# Patient Record
Sex: Female | Born: 1955 | Race: White | Hispanic: No | Smoking: Never smoker
Health system: Southern US, Community
[De-identification: ages and names within clinical notes are randomized; demographics above are authoritative.]

## PROBLEM LIST (undated history)

## (undated) DIAGNOSIS — M722 Plantar fascial fibromatosis: Secondary | ICD-10-CM

## (undated) DIAGNOSIS — M5416 Radiculopathy, lumbar region: Secondary | ICD-10-CM

## (undated) DIAGNOSIS — M549 Dorsalgia, unspecified: Secondary | ICD-10-CM

## (undated) DIAGNOSIS — D229 Melanocytic nevi, unspecified: Secondary | ICD-10-CM

## (undated) DIAGNOSIS — M5412 Radiculopathy, cervical region: Secondary | ICD-10-CM

## (undated) DIAGNOSIS — D239 Other benign neoplasm of skin, unspecified: Secondary | ICD-10-CM

## (undated) DIAGNOSIS — G8929 Other chronic pain: Secondary | ICD-10-CM

## (undated) DIAGNOSIS — M542 Cervicalgia: Secondary | ICD-10-CM

## (undated) HISTORY — PX: OTHER SURGICAL HISTORY: SHX169

## (undated) HISTORY — PX: ABDOMINAL HYSTERECTOMY: SHX81

## (undated) HISTORY — PX: APPENDECTOMY: SHX54

---
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
## 2001-07-24 DIAGNOSIS — D239 Other benign neoplasm of skin, unspecified: Secondary | ICD-10-CM

## 2001-07-24 DIAGNOSIS — D229 Melanocytic nevi, unspecified: Secondary | ICD-10-CM

## 2001-07-24 HISTORY — DX: Other benign neoplasm of skin, unspecified: D23.9

## 2001-07-24 HISTORY — DX: Melanocytic nevi, unspecified: D22.9

## 2004-02-24 ENCOUNTER — Ambulatory Visit (HOSPITAL_COMMUNITY): Admission: RE | Admit: 2004-02-24 | Discharge: 2004-02-24 | Payer: Self-pay | Admitting: Podiatry

## 2004-03-14 ENCOUNTER — Emergency Department (HOSPITAL_COMMUNITY): Admission: EM | Admit: 2004-03-14 | Discharge: 2004-03-14 | Payer: Self-pay | Admitting: Emergency Medicine

## 2004-04-03 ENCOUNTER — Ambulatory Visit (HOSPITAL_COMMUNITY): Admission: RE | Admit: 2004-04-03 | Discharge: 2004-04-03 | Payer: Self-pay | Admitting: Family Medicine

## 2005-04-21 ENCOUNTER — Encounter (INDEPENDENT_AMBULATORY_CARE_PROVIDER_SITE_OTHER): Payer: Self-pay | Admitting: Internal Medicine

## 2005-08-27 ENCOUNTER — Ambulatory Visit (HOSPITAL_COMMUNITY): Admission: RE | Admit: 2005-08-27 | Discharge: 2005-08-27 | Payer: Self-pay | Admitting: Internal Medicine

## 2005-08-27 ENCOUNTER — Ambulatory Visit: Payer: Self-pay | Admitting: Internal Medicine

## 2005-08-28 ENCOUNTER — Encounter (INDEPENDENT_AMBULATORY_CARE_PROVIDER_SITE_OTHER): Payer: Self-pay | Admitting: Internal Medicine

## 2005-08-28 LAB — CONVERTED CEMR LAB
HDL: 51 mg/dL
LDL Cholesterol: 122 mg/dL
Triglycerides: 189 mg/dL

## 2005-09-04 ENCOUNTER — Ambulatory Visit (HOSPITAL_COMMUNITY): Admission: RE | Admit: 2005-09-04 | Discharge: 2005-09-04 | Payer: Self-pay | Admitting: Internal Medicine

## 2005-09-14 ENCOUNTER — Encounter (INDEPENDENT_AMBULATORY_CARE_PROVIDER_SITE_OTHER): Payer: Self-pay | Admitting: Internal Medicine

## 2005-09-22 ENCOUNTER — Ambulatory Visit: Payer: Self-pay | Admitting: Internal Medicine

## 2005-09-23 ENCOUNTER — Encounter (INDEPENDENT_AMBULATORY_CARE_PROVIDER_SITE_OTHER): Payer: Self-pay | Admitting: Internal Medicine

## 2005-09-27 ENCOUNTER — Encounter (INDEPENDENT_AMBULATORY_CARE_PROVIDER_SITE_OTHER): Payer: Self-pay | Admitting: Internal Medicine

## 2005-09-30 ENCOUNTER — Ambulatory Visit (HOSPITAL_COMMUNITY): Admission: RE | Admit: 2005-09-30 | Discharge: 2005-09-30 | Payer: Self-pay | Admitting: Internal Medicine

## 2005-10-04 ENCOUNTER — Encounter (INDEPENDENT_AMBULATORY_CARE_PROVIDER_SITE_OTHER): Payer: Self-pay | Admitting: Internal Medicine

## 2006-02-01 ENCOUNTER — Ambulatory Visit: Payer: Self-pay | Admitting: Internal Medicine

## 2006-04-07 ENCOUNTER — Ambulatory Visit (HOSPITAL_COMMUNITY): Admission: RE | Admit: 2006-04-07 | Discharge: 2006-04-07 | Payer: Self-pay | Admitting: Unknown Physician Specialty

## 2006-04-14 ENCOUNTER — Encounter (INDEPENDENT_AMBULATORY_CARE_PROVIDER_SITE_OTHER): Payer: Self-pay | Admitting: Internal Medicine

## 2006-05-31 ENCOUNTER — Ambulatory Visit (HOSPITAL_COMMUNITY): Admission: RE | Admit: 2006-05-31 | Discharge: 2006-05-31 | Payer: Self-pay | Admitting: Orthopaedic Surgery

## 2006-09-12 ENCOUNTER — Telehealth (INDEPENDENT_AMBULATORY_CARE_PROVIDER_SITE_OTHER): Payer: Self-pay | Admitting: *Deleted

## 2006-09-15 ENCOUNTER — Encounter (INDEPENDENT_AMBULATORY_CARE_PROVIDER_SITE_OTHER): Payer: Self-pay | Admitting: Internal Medicine

## 2007-01-16 ENCOUNTER — Encounter (INDEPENDENT_AMBULATORY_CARE_PROVIDER_SITE_OTHER): Payer: Self-pay | Admitting: Internal Medicine

## 2007-01-16 DIAGNOSIS — M542 Cervicalgia: Secondary | ICD-10-CM | POA: Insufficient documentation

## 2007-01-16 DIAGNOSIS — E785 Hyperlipidemia, unspecified: Secondary | ICD-10-CM | POA: Insufficient documentation

## 2007-01-16 DIAGNOSIS — G5600 Carpal tunnel syndrome, unspecified upper limb: Secondary | ICD-10-CM | POA: Insufficient documentation

## 2007-01-16 DIAGNOSIS — Z87442 Personal history of urinary calculi: Secondary | ICD-10-CM | POA: Insufficient documentation

## 2007-01-16 DIAGNOSIS — M549 Dorsalgia, unspecified: Secondary | ICD-10-CM | POA: Insufficient documentation

## 2007-03-15 ENCOUNTER — Encounter (INDEPENDENT_AMBULATORY_CARE_PROVIDER_SITE_OTHER): Payer: Self-pay | Admitting: Internal Medicine

## 2007-05-03 ENCOUNTER — Encounter (INDEPENDENT_AMBULATORY_CARE_PROVIDER_SITE_OTHER): Payer: Self-pay | Admitting: Internal Medicine

## 2007-07-20 ENCOUNTER — Ambulatory Visit: Payer: Self-pay | Admitting: Internal Medicine

## 2007-07-20 DIAGNOSIS — E669 Obesity, unspecified: Secondary | ICD-10-CM | POA: Insufficient documentation

## 2007-08-15 ENCOUNTER — Telehealth (INDEPENDENT_AMBULATORY_CARE_PROVIDER_SITE_OTHER): Payer: Self-pay | Admitting: *Deleted

## 2007-08-16 ENCOUNTER — Ambulatory Visit: Payer: Self-pay | Admitting: Internal Medicine

## 2007-08-17 LAB — CONVERTED CEMR LAB
Basophils Relative: 0 % (ref 0–1)
CRP: 2.6 mg/dL — ABNORMAL HIGH (ref <0.6)
Hemoglobin: 13.8 g/dL (ref 12.0–15.0)
Lymphocytes Relative: 30 % (ref 12–46)
Lymphs Abs: 2.1 10*3/uL (ref 0.7–4.0)
Monocytes Relative: 8 % (ref 3–12)
Neutro Abs: 3.8 10*3/uL (ref 1.7–7.7)
Neutrophils Relative %: 55 % (ref 43–77)
RBC: 4.67 M/uL (ref 3.87–5.11)
TSH: 0.873 microintl units/mL (ref 0.350–4.50)
WBC: 7 10*3/uL (ref 4.0–10.5)

## 2007-08-31 ENCOUNTER — Ambulatory Visit (HOSPITAL_COMMUNITY): Admission: RE | Admit: 2007-08-31 | Discharge: 2007-08-31 | Payer: Self-pay | Admitting: Unknown Physician Specialty

## 2007-09-01 ENCOUNTER — Ambulatory Visit: Payer: Self-pay | Admitting: Internal Medicine

## 2007-09-01 ENCOUNTER — Ambulatory Visit (HOSPITAL_COMMUNITY): Admission: RE | Admit: 2007-09-01 | Discharge: 2007-09-01 | Payer: Self-pay | Admitting: Internal Medicine

## 2007-09-07 ENCOUNTER — Telehealth (INDEPENDENT_AMBULATORY_CARE_PROVIDER_SITE_OTHER): Payer: Self-pay | Admitting: Internal Medicine

## 2007-09-08 ENCOUNTER — Encounter (INDEPENDENT_AMBULATORY_CARE_PROVIDER_SITE_OTHER): Payer: Self-pay | Admitting: Internal Medicine

## 2007-09-08 ENCOUNTER — Ambulatory Visit (HOSPITAL_COMMUNITY): Admission: RE | Admit: 2007-09-08 | Discharge: 2007-09-08 | Payer: Self-pay | Admitting: Internal Medicine

## 2007-09-11 ENCOUNTER — Encounter (INDEPENDENT_AMBULATORY_CARE_PROVIDER_SITE_OTHER): Payer: Self-pay | Admitting: Internal Medicine

## 2007-11-01 ENCOUNTER — Ambulatory Visit: Payer: Self-pay | Admitting: Internal Medicine

## 2007-11-09 ENCOUNTER — Encounter (INDEPENDENT_AMBULATORY_CARE_PROVIDER_SITE_OTHER): Payer: Self-pay | Admitting: Internal Medicine

## 2008-04-11 ENCOUNTER — Ambulatory Visit: Payer: Self-pay | Admitting: Internal Medicine

## 2008-05-09 ENCOUNTER — Encounter (INDEPENDENT_AMBULATORY_CARE_PROVIDER_SITE_OTHER): Payer: Self-pay | Admitting: Internal Medicine

## 2008-06-03 ENCOUNTER — Ambulatory Visit: Payer: Self-pay | Admitting: Internal Medicine

## 2008-06-03 DIAGNOSIS — J309 Allergic rhinitis, unspecified: Secondary | ICD-10-CM | POA: Insufficient documentation

## 2008-06-03 DIAGNOSIS — L02419 Cutaneous abscess of limb, unspecified: Secondary | ICD-10-CM | POA: Insufficient documentation

## 2008-06-03 DIAGNOSIS — L03119 Cellulitis of unspecified part of limb: Secondary | ICD-10-CM

## 2008-11-28 ENCOUNTER — Encounter (INDEPENDENT_AMBULATORY_CARE_PROVIDER_SITE_OTHER): Payer: Self-pay | Admitting: Internal Medicine

## 2010-03-08 ENCOUNTER — Encounter: Payer: Self-pay | Admitting: Internal Medicine

## 2010-03-08 ENCOUNTER — Encounter: Payer: Self-pay | Admitting: Unknown Physician Specialty

## 2010-12-03 ENCOUNTER — Other Ambulatory Visit (HOSPITAL_BASED_OUTPATIENT_CLINIC_OR_DEPARTMENT_OTHER): Payer: Self-pay | Admitting: Internal Medicine

## 2010-12-03 ENCOUNTER — Other Ambulatory Visit (HOSPITAL_COMMUNITY): Payer: Self-pay | Admitting: Internal Medicine

## 2010-12-03 DIAGNOSIS — Z139 Encounter for screening, unspecified: Secondary | ICD-10-CM

## 2010-12-04 ENCOUNTER — Ambulatory Visit (HOSPITAL_COMMUNITY)
Admission: RE | Admit: 2010-12-04 | Discharge: 2010-12-04 | Disposition: A | Discharge status: Home / Self Care | Payer: 59 | Source: Ambulatory Visit | Attending: Internal Medicine | Admitting: Internal Medicine

## 2010-12-04 DIAGNOSIS — Z139 Encounter for screening, unspecified: Secondary | ICD-10-CM

## 2010-12-04 DIAGNOSIS — Z1231 Encounter for screening mammogram for malignant neoplasm of breast: Secondary | ICD-10-CM | POA: Insufficient documentation

## 2011-12-12 ENCOUNTER — Other Ambulatory Visit: Payer: Self-pay | Admitting: Family Medicine

## 2011-12-12 DIAGNOSIS — Z139 Encounter for screening, unspecified: Secondary | ICD-10-CM

## 2011-12-13 ENCOUNTER — Other Ambulatory Visit (HOSPITAL_COMMUNITY): Payer: Self-pay | Admitting: Unknown Physician Specialty

## 2011-12-13 ENCOUNTER — Ambulatory Visit (HOSPITAL_COMMUNITY)
Admission: RE | Admit: 2011-12-13 | Discharge: 2011-12-13 | Disposition: A | Discharge status: Home / Self Care | Payer: 59 | Source: Ambulatory Visit | Attending: Unknown Physician Specialty | Admitting: Unknown Physician Specialty

## 2011-12-13 DIAGNOSIS — Z139 Encounter for screening, unspecified: Secondary | ICD-10-CM

## 2011-12-13 DIAGNOSIS — Z1231 Encounter for screening mammogram for malignant neoplasm of breast: Secondary | ICD-10-CM | POA: Insufficient documentation

## 2012-04-03 ENCOUNTER — Telehealth: Payer: Self-pay | Admitting: Urgent Care

## 2012-04-03 NOTE — Telephone Encounter (Signed)
Pt called to be set up for tcs per her PCP. [phone]

## 2012-04-04 NOTE — Telephone Encounter (Signed)
LMOM to call.

## 2012-04-05 ENCOUNTER — Telehealth (INDEPENDENT_AMBULATORY_CARE_PROVIDER_SITE_OTHER): Payer: Self-pay | Admitting: *Deleted

## 2012-04-05 ENCOUNTER — Other Ambulatory Visit (INDEPENDENT_AMBULATORY_CARE_PROVIDER_SITE_OTHER): Payer: Self-pay | Admitting: *Deleted

## 2012-04-05 DIAGNOSIS — Z1211 Encounter for screening for malignant neoplasm of colon: Secondary | ICD-10-CM

## 2012-04-05 MED ORDER — PEG-KCL-NACL-NASULF-NA ASC-C 100 G PO SOLR: 1.0000 | Freq: Once | ORAL | Status: DC

## 2012-04-05 NOTE — Telephone Encounter (Signed)
Patient needs movi prep 

## 2012-04-06 NOTE — Telephone Encounter (Signed)
Pt is scheduled with NUR for 05/11/2012.

## 2012-04-12 ENCOUNTER — Telehealth (INDEPENDENT_AMBULATORY_CARE_PROVIDER_SITE_OTHER): Payer: Self-pay | Admitting: *Deleted

## 2012-04-12 NOTE — Telephone Encounter (Signed)
  Procedure: tcs  Reason/Indication:  screening  Has patient had this procedure before?  no  If so, when, by whom and where?    Is there a family history of colon cancer?  no  Who?  What age when diagnosed?    Is patient diabetic?   no      Does patient have prosthetic heart valve?  no  Do you have a pacemaker?  no  Has patient had joint replacement within last 12 months?  no  Is patient on Coumadin, Plavix and/or Aspirin? no  Medications: voltaran 75 mg daily  Allergies: nkda  Medication Adjustment:   Procedure date & time: 05/11/12 at 1030

## 2012-04-12 NOTE — Telephone Encounter (Signed)
agree

## 2012-05-11 ENCOUNTER — Ambulatory Visit (HOSPITAL_COMMUNITY)
Admission: RE | Admit: 2012-05-11 | Discharge: 2012-05-11 | Disposition: A | Discharge status: Home / Self Care | Payer: 59 | Source: Ambulatory Visit | Attending: Internal Medicine | Admitting: Internal Medicine

## 2012-05-11 ENCOUNTER — Encounter (HOSPITAL_COMMUNITY): Payer: Self-pay | Admitting: *Deleted

## 2012-05-11 ENCOUNTER — Encounter (HOSPITAL_COMMUNITY)
Admission: RE | Disposition: A | Discharge status: Home / Self Care | Payer: Self-pay | Source: Ambulatory Visit | Attending: Internal Medicine

## 2012-05-11 DIAGNOSIS — Z1211 Encounter for screening for malignant neoplasm of colon: Secondary | ICD-10-CM

## 2012-05-11 DIAGNOSIS — K5730 Diverticulosis of large intestine without perforation or abscess without bleeding: Secondary | ICD-10-CM | POA: Insufficient documentation

## 2012-05-11 DIAGNOSIS — D126 Benign neoplasm of colon, unspecified: Secondary | ICD-10-CM | POA: Insufficient documentation

## 2012-05-11 HISTORY — PX: COLONOSCOPY: SHX5424

## 2012-05-11 SURGERY — COLONOSCOPY
Anesthesia: Moderate Sedation

## 2012-05-11 MED ORDER — MEPERIDINE HCL 50 MG/ML IJ SOLN
INTRAMUSCULAR | Status: DC | PRN
  Administered 2012-05-11 (×2): 25 mg via INTRAVENOUS

## 2012-05-11 MED ORDER — MIDAZOLAM HCL 5 MG/5ML IJ SOLN
INTRAMUSCULAR | Status: DC | PRN
  Administered 2012-05-11: 1 mg via INTRAVENOUS
  Administered 2012-05-11 (×2): 2 mg via INTRAVENOUS

## 2012-05-11 MED ORDER — MEPERIDINE HCL 50 MG/ML IJ SOLN
INTRAMUSCULAR | Status: AC
  Filled 2012-05-11: qty 1

## 2012-05-11 MED ORDER — STERILE WATER FOR IRRIGATION IR SOLN
Status: DC | PRN
  Administered 2012-05-11: 11:00:00

## 2012-05-11 MED ORDER — SODIUM CHLORIDE 0.45 % IV SOLN: INTRAVENOUS | Status: DC

## 2012-05-11 MED ORDER — MIDAZOLAM HCL 5 MG/5ML IJ SOLN
INTRAMUSCULAR | Status: AC
  Filled 2012-05-11: qty 10

## 2012-05-11 MED ORDER — SODIUM CHLORIDE 0.9 % IV SOLN
INTRAVENOUS | Status: DC
  Administered 2012-05-11: 1000 mL via INTRAVENOUS

## 2012-05-11 NOTE — H&P (Signed)
Lauren Hansen is an 57 y.o. female.   Chief Complaint: Patient is here for colonoscopy. HPI: Patient is 57 year old Caucasian female who is in for screening colonoscopy. She denies diarrhea constipation rectal bleeding or abdominal pain. Family history is negative for CRC.  History reviewed. No pertinent past medical history.  Past Surgical History  Procedure Laterality Date  . Appendectomy    . Right hand surg     . Abdominal hysterectomy      History reviewed. No pertinent family history. Social History:  reports that she has never smoked. She does not have any smokeless tobacco history on file. She reports that  drinks alcohol. She reports that she does not use illicit drugs.  Allergies: Not on File  Medications Prior to Admission  Medication Sig Dispense Refill  . diclofenac (VOLTAREN) 75 MG EC tablet Take 75 mg by mouth once.      . peg 3350 powder (MOVIPREP) 100 G SOLR Take 1 kit (100 g total) by mouth once.  1 kit  0    No results found for this or any previous visit (from the past 48 hour(s)). No results found.  ROS  Blood pressure 122/59, pulse 61, temperature 98.5 F (36.9 C), temperature source Oral, resp. rate 20, height 5\' 6"  (1.676 m), weight 199 lb (90.266 kg), SpO2 94.00%. Physical Exam  Constitutional: She appears well-developed and well-nourished.  HENT:  Mouth/Throat: Oropharynx is clear and moist.  Eyes: Conjunctivae are normal. No scleral icterus.  Neck: No thyromegaly present.  Cardiovascular: Normal rate, regular rhythm and normal heart sounds.   No murmur heard. Respiratory: Effort normal and breath sounds normal.  GI: Soft. She exhibits no distension and no mass. There is no tenderness.  Musculoskeletal: She exhibits no edema.  Lymphadenopathy:    She has no cervical adenopathy.  Neurological: She is alert.  Skin: Skin is dry.     Assessment/Plan Average risk screening colonoscopy.  Lauren Hansen U 05/11/2012, 10:39 AM

## 2012-05-11 NOTE — Op Note (Signed)
COLONOSCOPY PROCEDURE REPORT  PATIENT:  Lauren Hansen  MR#:  [id number] Birthdate:  [birth year], 56 y.o., female Endoscopist:  Dr. Malissa Hippo, MD Referred By:  Dr. Dwana Melena, MD  Procedure Date: 05/11/2012  Procedure:   Colonoscopy  Indications:  Patient is 57 year old Caucasian female was undergoing average risk screening colonoscopy.  Informed Consent:  The procedure and risks were reviewed with the patient and informed consent was obtained.  Medications:  Demerol 50 mg IV Versed 5 mg IV  Description of procedure:  After a digital rectal exam was performed, that colonoscope was advanced from the anus through the rectum and colon to the area of the cecum, ileocecal valve and appendiceal orifice. The cecum was deeply intubated. These structures were well-seen and photographed for the record. From the level of the cecum and ileocecal valve, the scope was slowly and cautiously withdrawn. The mucosal surfaces were carefully surveyed utilizing scope tip to flexion to facilitate fold flattening as needed. The scope was pulled down into the rectum where a thorough exam including retroflexion was performed.  Findings:   Prep excellent Few scattered diverticula from sigmoid colon to cecum. 3 mm polyp ablated via cold biopsy from ascending colon. Normal mucosa of rectum and anorectal junction.   Therapeutic/Diagnostic Maneuvers Performed:  See above  Complications:  None  Cecal Withdrawal Time:  10 minutes  Impression:  Examination performed to cecum. Pancolonic diverticulosis(few scattered diverticula throughout the colon). 3 mm polyp ablated via cold biopsy from ascending colon.  Recommendations:  Standard instructions given. I will contact patient with biopsy results and further recommendations.  Khloe Hunkele U  05/11/2012 11:08 AM  CC: Dr. Dwana Melena, MD & Dr. Bonnetta Barry ref. provider found     '

## 2012-05-15 ENCOUNTER — Encounter (INDEPENDENT_AMBULATORY_CARE_PROVIDER_SITE_OTHER): Payer: Self-pay | Admitting: *Deleted

## 2012-05-15 ENCOUNTER — Encounter (HOSPITAL_COMMUNITY): Payer: Self-pay | Admitting: Internal Medicine

## 2012-11-30 ENCOUNTER — Other Ambulatory Visit (HOSPITAL_COMMUNITY): Payer: Self-pay | Admitting: Internal Medicine

## 2012-11-30 ENCOUNTER — Other Ambulatory Visit (HOSPITAL_BASED_OUTPATIENT_CLINIC_OR_DEPARTMENT_OTHER): Payer: Self-pay | Admitting: Internal Medicine

## 2012-11-30 DIAGNOSIS — Z139 Encounter for screening, unspecified: Secondary | ICD-10-CM

## 2012-12-14 ENCOUNTER — Ambulatory Visit (HOSPITAL_COMMUNITY)
Admission: RE | Admit: 2012-12-14 | Discharge: 2012-12-14 | Disposition: A | Discharge status: Home / Self Care | Payer: 59 | Source: Ambulatory Visit | Attending: Internal Medicine | Admitting: Internal Medicine

## 2012-12-14 DIAGNOSIS — Z139 Encounter for screening, unspecified: Secondary | ICD-10-CM

## 2012-12-14 DIAGNOSIS — Z1231 Encounter for screening mammogram for malignant neoplasm of breast: Secondary | ICD-10-CM | POA: Insufficient documentation

## 2013-02-05 ENCOUNTER — Other Ambulatory Visit: Payer: Self-pay | Admitting: Dermatology

## 2014-06-18 ENCOUNTER — Emergency Department (HOSPITAL_COMMUNITY): Payer: PRIVATE HEALTH INSURANCE

## 2014-06-18 ENCOUNTER — Emergency Department (HOSPITAL_COMMUNITY)
Admission: EM | Admit: 2014-06-18 | Discharge: 2014-06-18 | Disposition: A | Discharge status: Home / Self Care | Payer: PRIVATE HEALTH INSURANCE | Attending: Emergency Medicine | Admitting: Emergency Medicine

## 2014-06-18 ENCOUNTER — Encounter (HOSPITAL_COMMUNITY): Payer: Self-pay

## 2014-06-18 DIAGNOSIS — S99921A Unspecified injury of right foot, initial encounter: Secondary | ICD-10-CM | POA: Diagnosis not present

## 2014-06-18 DIAGNOSIS — Y990 Civilian activity done for income or pay: Secondary | ICD-10-CM | POA: Insufficient documentation

## 2014-06-18 DIAGNOSIS — Z791 Long term (current) use of non-steroidal anti-inflammatories (NSAID): Secondary | ICD-10-CM | POA: Insufficient documentation

## 2014-06-18 DIAGNOSIS — S39012A Strain of muscle, fascia and tendon of lower back, initial encounter: Secondary | ICD-10-CM | POA: Insufficient documentation

## 2014-06-18 DIAGNOSIS — Y9289 Other specified places as the place of occurrence of the external cause: Secondary | ICD-10-CM | POA: Insufficient documentation

## 2014-06-18 DIAGNOSIS — W010XXA Fall on same level from slipping, tripping and stumbling without subsequent striking against object, initial encounter: Secondary | ICD-10-CM | POA: Insufficient documentation

## 2014-06-18 DIAGNOSIS — S8001XA Contusion of right knee, initial encounter: Secondary | ICD-10-CM | POA: Insufficient documentation

## 2014-06-18 DIAGNOSIS — Y9389 Activity, other specified: Secondary | ICD-10-CM | POA: Diagnosis not present

## 2014-06-18 DIAGNOSIS — S8991XA Unspecified injury of right lower leg, initial encounter: Secondary | ICD-10-CM | POA: Diagnosis present

## 2014-06-18 DIAGNOSIS — M79671 Pain in right foot: Secondary | ICD-10-CM

## 2014-06-18 MED ORDER — METHOCARBAMOL 500 MG PO TABS: 1000.0000 mg | ORAL_TABLET | Freq: Every day | ORAL | Status: DC

## 2014-06-18 MED ORDER — IBUPROFEN 800 MG PO TABS: 800.0000 mg | ORAL_TABLET | Freq: Three times a day (TID) | ORAL | Status: DC

## 2014-06-18 NOTE — ED Notes (Signed)
Pt c/o severe lower back pain when attempting to get up for xrays.

## 2014-06-18 NOTE — ED Notes (Signed)
Lily Kocher, PA called for pt at 1642 with no response

## 2014-06-18 NOTE — ED Notes (Signed)
Pt reports slipped in water at work and fell.  C/o pain to r knee and r foot.

## 2014-06-18 NOTE — ED Notes (Signed)
Pt advised to go to [facility] to check on need for drug screen.

## 2014-06-18 NOTE — Discharge Instructions (Signed)
Your x-rays are negative for fracture or dislocation. There are multiple areas of arthritis noted in your lower back, your knee, and your foot. There are degenerative disc disease changes noted in your lower back. Please discuss this with Dr. Nevada Crane. Please use ibuprofen 3 times daily with food. Please use Robaxin at bedtime for muscle spasms and tightness. Muscle Strain A muscle strain (pulled muscle) happens when a muscle is stretched beyond normal length. It happens when a sudden, violent force stretches your muscle too far. Usually, a few of the fibers in your muscle are torn. Muscle strain is common in athletes. Recovery usually takes 1-2 weeks. Complete healing takes 5-6 weeks.  HOME CARE   Follow the PRICE method of treatment to help your injury get better. Do this the first 2-3 days after the injury:  Protect. Protect the muscle to keep it from getting injured again.  Rest. Limit your activity and rest the injured body part.  Ice. Put ice in a plastic bag. Place a towel between your skin and the bag. Then, apply the ice and leave it on from 15-20 minutes each hour. After the third day, switch to moist heat packs.  Compression. Use a splint or elastic bandage on the injured area for comfort. Do not put it on too tightly.  Elevate. Keep the injured body part above the level of your heart.  Only take medicine as told by your doctor.  Warm up before doing exercise to prevent future muscle strains. GET HELP IF:   You have more pain or puffiness (swelling) in the injured area.  You feel numbness, tingling, or notice a loss of strength in the injured area. MAKE SURE YOU:   Understand these instructions.  Will watch your condition.  Will get help right away if you are not doing well or get worse. Document Released: 11/11/2007 Document Revised: 11/22/2012 Document Reviewed: 08/31/2012 [facility] Patient Information 2015 [place], [place]. This information is not intended to replace advice  given to you by your health care provider. Make sure you discuss any questions you have with your health care provider.  Contusion A contusion is the result of an injury to the skin and underlying tissues and is usually caused by direct trauma. The injury results in the appearance of a bruise on the skin overlying the injured tissues. Contusions cause rupture and bleeding of the small capillaries and blood vessels and affect function, because the bleeding infiltrates muscles, tendons, nerves, or other soft tissues.  SYMPTOMS   Swelling and often a hard lump in the injured area, either superficial or deep.  Pain and tenderness over the area of the contusion.  Feeling of firmness when pressure is exerted over the contusion.  Discoloration under the skin, beginning with redness and progressing to the characteristic "black and blue" bruise. CAUSES  A contusion is typically the result of direct trauma. This is often by a blunt object.  RISK INCREASES WITH:  Sports that have a high likelihood of trauma (football, boxing, ice hockey, soccer, field hockey, martial arts, basketball, and baseball).  Sports that make falling from a height likely (high-jumping, pole-vaulting, skating, or gymnastics).  Any bleeding disorder (hemophilia) or taking medications that affect clotting (aspirin, nonsteroidal anti-inflammatory medications, or warfarin [Coumadin]).  Inadequate protection of exposed areas during contact sports. PREVENTION  Maintain physical fitness:  Joint and muscle flexibility.  Strength and endurance.  Coordination.  Wear proper protective equipment. Make sure it fits correctly. PROGNOSIS  Contusions typically heal without any complications. Healing  time varies with the severity of injury and intake of medications that affect clotting. Contusions usually heal in 1 to 4 weeks. RELATED COMPLICATIONS   Damage to nearby nerves or blood vessels, causing numbness, coldness, or  paleness.  Compartment syndrome.  Bleeding into the soft tissues that leads to disability.  Infiltrative-type bleeding, leading to the calcification and impaired function of the injured muscle (rare).  Prolonged healing time if usual activities are resumed too soon.  Infection if the skin over the injury site is broken.  Fracture of the bone underlying the contusion.  Stiffness in the joint where the injured muscle crosses. TREATMENT  Treatment initially consists of resting the injured area as well as medication and ice to reduce inflammation. The use of a compression bandage may also be helpful in minimizing inflammation. As pain diminishes and movement is tolerated, the joint where the affected muscle crosses should be moved to prevent stiffness and the shortening (contracture) of the joint. Movement of the joint should begin as soon as possible. It is also important to work on maintaining strength within the affected muscles. Occasionally, extra padding over the area of contusion may be recommended before returning to sports, particularly if re-injury is likely.  MEDICATION   If pain relief is necessary these medications are often recommended:  Nonsteroidal anti-inflammatory medications, such as aspirin and ibuprofen.  Other minor pain relievers, such as acetaminophen, are often recommended.  Prescription pain relievers may be given by your caregiver. Use only as directed and only as much as you need. HEAT AND COLD  Cold treatment (icing) relieves pain and reduces inflammation. Cold treatment should be applied for 10 to 15 minutes every 2 to 3 hours for inflammation and pain and immediately after any activity that aggravates your symptoms. Use ice packs or an ice massage. (To do an ice massage fill a large styrofoam cup with water and freeze. Tear a small amount of foam from the top so ice protrudes. Massage ice firmly over the injured area in a circle about the size of a  softball.)  Heat treatment may be used prior to performing the stretching and strengthening activities prescribed by your caregiver, physical therapist, or athletic trainer. Use a heat pack or a warm soak. SEEK MEDICAL CARE IF:   Symptoms get worse or do not improve despite treatment in a few days.  You have difficulty moving a joint.  Any extremity becomes extremely painful, numb, pale, or cool (This is an emergency!).  Medication produces any side effects (bleeding, upset stomach, or allergic reaction).  Signs of infection (drainage from skin, headache, muscle aches, dizziness, fever, or general ill feeling) occur if skin was broken. Document Released: 02/01/2005 Document Revised: 04/26/2011 Document Reviewed: 05/16/2008 [facility] Patient Information 2015 [place], [place]. This information is not intended to replace advice given to you by your health care provider. Make sure you discuss any questions you have with your health care provider.

## 2014-06-18 NOTE — ED Provider Notes (Signed)
CSN: [id number]     Arrival date & time 06/18/14  1507 History   First MD Initiated Contact with Patient 06/18/14 1651     Chief Complaint  Patient presents with  . Knee Pain     (Consider location/radiation/quality/duration/timing/severity/associated sxs/prior Treatment) Patient is a 59 y.o. female presenting with knee pain. The history is provided by the patient.  Knee Pain Location:  Knee Injury: yes   Mechanism of injury: fall   Fall:    Fall occurred:  Standing (on a wet floor at work)   Impact surface:  Journalist, newspaper of impact:  Knees   Entrapped after fall: no   Knee location:  R knee Pain details:    Quality:  Aching   Radiates to:  Does not radiate   Severity:  Moderate   Onset quality:  Sudden   Duration:  3 hours   Timing:  Constant   Progression:  Worsening Chronicity:  New Dislocation: no   Foreign body present:  No foreign bodies Relieved by:  Nothing Worsened by:  Bearing weight (movement of lower back) Ineffective treatments:  None tried Associated symptoms: back pain and stiffness   Associated symptoms: no neck pain and no numbness   Associated symptoms comment:  Right foot pain Risk factors: no frequent fractures, no known bone disorder and no recent illness     No past medical history on file. Past Surgical History  Procedure Laterality Date  . Appendectomy    . Right hand surg     . Abdominal hysterectomy    . Colonoscopy N/A 05/11/2012    Procedure: COLONOSCOPY;  Surgeon: Rogene Houston, MD;  Location: [facility];  Service: Endoscopy;  Laterality: N/A;  1030   No family history on file. History  Substance Use Topics  . Smoking status: Never Smoker   . Smokeless tobacco: Not on file  . Alcohol Use: Yes     Comment: social use   OB History    No data available     Review of Systems  Constitutional: Negative for activity change.       All ROS Neg except as noted in HPI  HENT: Negative for nosebleeds.   Eyes: Negative for  photophobia and discharge.  Respiratory: Negative for cough, shortness of breath and wheezing.   Cardiovascular: Negative for chest pain and palpitations.  Gastrointestinal: Negative for abdominal pain and blood in stool.  Genitourinary: Negative for dysuria, frequency and hematuria.  Musculoskeletal: Positive for back pain and stiffness. Negative for arthralgias and neck pain.  Skin: Negative.   Neurological: Negative for dizziness, seizures and speech difficulty.  Psychiatric/Behavioral: Negative for hallucinations and confusion.  All other systems reviewed and are negative.     Allergies  Review of patient's allergies indicates no known allergies.  Home Medications   Prior to Admission medications   Medication Sig Start Date End Date Taking? Authorizing Provider  diclofenac (VOLTAREN) 75 MG EC tablet Take 75 mg by mouth once.    Historical Provider, MD   BP 123/71 mmHg  Pulse 58  Temp(Src) 97.6 F (36.4 C) (Oral)  Resp 18  Ht 5\' 6"  (1.676 m)  Wt 195 lb (88.451 kg)  BMI 31.49 kg/m2  SpO2 98% Physical Exam  Constitutional: She is oriented to person, place, and time. She appears well-developed and well-nourished.  Non-toxic appearance.  HENT:  Head: Normocephalic.  Right Ear: Tympanic membrane and external ear normal.  Left Ear: Tympanic membrane and external ear normal.  Eyes: EOM and lids are normal. Pupils are equal, round, and reactive to light.  Neck: Normal range of motion. Neck supple. Carotid bruit is not present.  Cardiovascular: Normal rate, regular rhythm, normal heart sounds, intact distal pulses and normal pulses.   Pulmonary/Chest: Breath sounds normal. No respiratory distress.  Abdominal: Soft. Bowel sounds are normal. There is no tenderness. There is no guarding.  Musculoskeletal: Normal range of motion.  There is good range of motion of the right hip. There is no evidence of dislocation no deformity. There is crepitus with range of motion of the right  knee. There is no effusion noted. There is no deformity of the quadricep area. There is no deformity of the anterior tibial tuberosity. There is no posterior mass. There is no deformity of the femoral area. The Achilles tendon is intact. The dorsalis pedis and posterior tibial pulses are 2+. There is pain to palpation of the tarsal bone areas. There is pain with flexion and extension of the toes. Capillary refill on the right is less than 2 seconds. His pain with change of position in the lower back. There is no palpable step off.  Lymphadenopathy:       Head (right side): No submandibular adenopathy present.       Head (left side): No submandibular adenopathy present.    She has no cervical adenopathy.  Neurological: She is alert and oriented to person, place, and time. She has normal strength. No cranial nerve deficit or sensory deficit.  Skin: Skin is warm and dry.  Psychiatric: She has a normal mood and affect. Her speech is normal.  Nursing note and vitals reviewed.   ED Course  Procedures (including critical care time) Labs Review Labs Reviewed - No data to display  Imaging Review Dg Knee Complete 4 Views Right  06/18/2014   CLINICAL DATA:  Patient slipped on puddle of water and fell  EXAM: RIGHT KNEE - COMPLETE 4+ VIEW  COMPARISON:  None.  FINDINGS: Frontal, lateral, and bilateral oblique views were obtained. There is no fracture, dislocation, or effusion. There is slight narrowing medially and laterally. There is a spur arising from the anterior superior patella. There is minimal intracondylar spurring. No erosive change. There is a small amount of calcification in the medial aspect of the joint.  IMPRESSION: Mild osteoarthritic change. No fracture or dislocation. No apparent joint effusion. Small amount of calcification in the medial meniscal region. This finding may be due to osteoarthritis but also could be due to chondrocalcinosis.   Electronically Signed   By: Lowella Grip III  M.D.   On: 06/18/2014 16:04   Dg Foot Complete Right  06/18/2014   CLINICAL DATA:  Patient slipped on puddle of water and fell  EXAM: RIGHT FOOT COMPLETE - 3+ VIEW  COMPARISON:  None.  FINDINGS: Frontal, oblique, and lateral views obtained. There is no fracture or dislocation. There is mild narrowing of all PIP and DIP joints. There is spurring in the dorsal midfoot. There are spurs arising from the posterior and inferior calcaneus.  IMPRESSION: Areas of osteoarthritic change. Calcaneal spurs. No acute fracture or dislocation.   Electronically Signed   By: Lowella Grip III M.D.   On: 06/18/2014 16:07     EKG Interpretation None      MDM  Vital signs stable. Xray of the right knee neg for fx or dislocation. Osteoarthritis changes noted. Xray of the right foot negative for fx or dislocation. Osteoarthritis changes noted. Plan - Pt to rest  back and foot as much as possible. Ibuprofen and robaxin Rx given to the patient. Pt to follow up with Dr Nevada Crane for follow up and recheck.   Final diagnoses:  None    **I have reviewed nursing notes, vital signs, and all appropriate lab and imaging results for this patient.Lily Kocher, PA-C 06/19/14 [place], MD 06/22/14 [phone]

## 2014-11-20 ENCOUNTER — Other Ambulatory Visit (HOSPITAL_COMMUNITY): Payer: Self-pay | Admitting: Internal Medicine

## 2014-11-20 DIAGNOSIS — Z1231 Encounter for screening mammogram for malignant neoplasm of breast: Secondary | ICD-10-CM

## 2014-11-22 ENCOUNTER — Encounter (HOSPITAL_COMMUNITY): Payer: Self-pay | Admitting: Emergency Medicine

## 2014-11-22 ENCOUNTER — Emergency Department (HOSPITAL_COMMUNITY): Payer: 59

## 2014-11-22 ENCOUNTER — Emergency Department (HOSPITAL_COMMUNITY)
Admission: EM | Admit: 2014-11-22 | Discharge: 2014-11-22 | Disposition: A | Discharge status: Home / Self Care | Payer: 59 | Attending: Emergency Medicine | Admitting: Emergency Medicine

## 2014-11-22 DIAGNOSIS — G8929 Other chronic pain: Secondary | ICD-10-CM | POA: Insufficient documentation

## 2014-11-22 DIAGNOSIS — X58XXXA Exposure to other specified factors, initial encounter: Secondary | ICD-10-CM | POA: Diagnosis not present

## 2014-11-22 DIAGNOSIS — Y9389 Activity, other specified: Secondary | ICD-10-CM | POA: Insufficient documentation

## 2014-11-22 DIAGNOSIS — S3992XA Unspecified injury of lower back, initial encounter: Secondary | ICD-10-CM | POA: Diagnosis present

## 2014-11-22 DIAGNOSIS — Y9289 Other specified places as the place of occurrence of the external cause: Secondary | ICD-10-CM | POA: Insufficient documentation

## 2014-11-22 DIAGNOSIS — S39012A Strain of muscle, fascia and tendon of lower back, initial encounter: Secondary | ICD-10-CM | POA: Diagnosis not present

## 2014-11-22 DIAGNOSIS — Y990 Civilian activity done for income or pay: Secondary | ICD-10-CM | POA: Insufficient documentation

## 2014-11-22 DIAGNOSIS — Z79899 Other long term (current) drug therapy: Secondary | ICD-10-CM | POA: Diagnosis not present

## 2014-11-22 DIAGNOSIS — Z791 Long term (current) use of non-steroidal anti-inflammatories (NSAID): Secondary | ICD-10-CM | POA: Diagnosis not present

## 2014-11-22 HISTORY — DX: Radiculopathy, cervical region: M54.12

## 2014-11-22 HISTORY — DX: Cervicalgia: M54.2

## 2014-11-22 HISTORY — DX: Other chronic pain: G89.29

## 2014-11-22 HISTORY — DX: Dorsalgia, unspecified: M54.9

## 2014-11-22 HISTORY — DX: Radiculopathy, lumbar region: M54.16

## 2014-11-22 HISTORY — DX: Plantar fascial fibromatosis: M72.2

## 2014-11-22 MED ORDER — METAXALONE 800 MG PO TABS: 800.0000 mg | ORAL_TABLET | Freq: Three times a day (TID) | ORAL | Status: DC

## 2014-11-22 NOTE — Discharge Instructions (Signed)

## 2014-11-22 NOTE — ED Notes (Addendum)
Pain in low back radiating across, states she is having trouble walking

## 2014-11-22 NOTE — ED Notes (Signed)
Moving boxes at home last Saturday and pain has increased.  Rates pain 10/10 with movement.
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
## 2014-11-27 ENCOUNTER — Ambulatory Visit (HOSPITAL_COMMUNITY)
Admission: RE | Admit: 2014-11-27 | Discharge: 2014-11-27 | Disposition: A | Discharge status: Home / Self Care | Payer: 59 | Source: Ambulatory Visit | Attending: Internal Medicine | Admitting: Internal Medicine

## 2014-11-27 DIAGNOSIS — Z1231 Encounter for screening mammogram for malignant neoplasm of breast: Secondary | ICD-10-CM | POA: Insufficient documentation

## 2015-03-13 DIAGNOSIS — N390 Urinary tract infection, site not specified: Secondary | ICD-10-CM | POA: Diagnosis not present

## 2015-03-13 DIAGNOSIS — R1012 Left upper quadrant pain: Secondary | ICD-10-CM | POA: Diagnosis not present

## 2015-05-08 DIAGNOSIS — Z0000 Encounter for general adult medical examination without abnormal findings: Secondary | ICD-10-CM | POA: Diagnosis not present

## 2015-05-19 DIAGNOSIS — M797 Fibromyalgia: Secondary | ICD-10-CM | POA: Diagnosis not present

## 2015-05-19 DIAGNOSIS — R7301 Impaired fasting glucose: Secondary | ICD-10-CM | POA: Diagnosis not present

## 2015-05-19 DIAGNOSIS — E782 Mixed hyperlipidemia: Secondary | ICD-10-CM | POA: Diagnosis not present

## 2015-05-19 DIAGNOSIS — J301 Allergic rhinitis due to pollen: Secondary | ICD-10-CM | POA: Diagnosis not present

## 2015-09-02 ENCOUNTER — Other Ambulatory Visit: Payer: Self-pay | Admitting: Physician Assistant

## 2015-09-02 DIAGNOSIS — L570 Actinic keratosis: Secondary | ICD-10-CM | POA: Diagnosis not present

## 2015-09-02 DIAGNOSIS — D485 Neoplasm of uncertain behavior of skin: Secondary | ICD-10-CM | POA: Diagnosis not present

## 2015-11-17 DIAGNOSIS — E782 Mixed hyperlipidemia: Secondary | ICD-10-CM | POA: Diagnosis not present

## 2015-11-17 DIAGNOSIS — R7301 Impaired fasting glucose: Secondary | ICD-10-CM | POA: Diagnosis not present

## 2015-11-18 ENCOUNTER — Other Ambulatory Visit (HOSPITAL_COMMUNITY): Payer: Self-pay | Admitting: Internal Medicine

## 2015-11-18 DIAGNOSIS — Z1231 Encounter for screening mammogram for malignant neoplasm of breast: Secondary | ICD-10-CM

## 2015-11-20 ENCOUNTER — Ambulatory Visit (HOSPITAL_COMMUNITY): Payer: Self-pay

## 2015-11-20 ENCOUNTER — Other Ambulatory Visit (HOSPITAL_COMMUNITY): Payer: Self-pay | Admitting: Internal Medicine

## 2015-11-20 DIAGNOSIS — Z6830 Body mass index (BMI) 30.0-30.9, adult: Secondary | ICD-10-CM | POA: Diagnosis not present

## 2015-11-20 DIAGNOSIS — Z1231 Encounter for screening mammogram for malignant neoplasm of breast: Secondary | ICD-10-CM

## 2015-11-20 DIAGNOSIS — E6609 Other obesity due to excess calories: Secondary | ICD-10-CM | POA: Diagnosis not present

## 2015-11-20 DIAGNOSIS — R945 Abnormal results of liver function studies: Secondary | ICD-10-CM | POA: Diagnosis not present

## 2015-11-20 DIAGNOSIS — E782 Mixed hyperlipidemia: Secondary | ICD-10-CM | POA: Diagnosis not present

## 2015-11-20 DIAGNOSIS — Z23 Encounter for immunization: Secondary | ICD-10-CM | POA: Diagnosis not present

## 2015-11-20 DIAGNOSIS — R7301 Impaired fasting glucose: Secondary | ICD-10-CM | POA: Diagnosis not present

## 2015-12-01 ENCOUNTER — Ambulatory Visit (HOSPITAL_COMMUNITY): Payer: Self-pay

## 2015-12-17 ENCOUNTER — Ambulatory Visit (HOSPITAL_COMMUNITY): Payer: Self-pay

## 2016-01-29 ENCOUNTER — Ambulatory Visit (HOSPITAL_COMMUNITY)
Admission: RE | Admit: 2016-01-29 | Discharge: 2016-01-29 | Disposition: A | Discharge status: Home / Self Care | Payer: 59 | Source: Ambulatory Visit | Attending: Internal Medicine | Admitting: Internal Medicine

## 2016-01-29 DIAGNOSIS — Z1231 Encounter for screening mammogram for malignant neoplasm of breast: Secondary | ICD-10-CM | POA: Insufficient documentation

## 2016-02-04 ENCOUNTER — Ambulatory Visit (HOSPITAL_COMMUNITY): Payer: Self-pay

## 2017-06-30 ENCOUNTER — Other Ambulatory Visit: Payer: Self-pay | Admitting: Physician Assistant

## 2017-06-30 DIAGNOSIS — L219 Seborrheic dermatitis, unspecified: Secondary | ICD-10-CM | POA: Diagnosis not present

## 2017-06-30 DIAGNOSIS — L570 Actinic keratosis: Secondary | ICD-10-CM | POA: Diagnosis not present

## 2017-06-30 DIAGNOSIS — D229 Melanocytic nevi, unspecified: Secondary | ICD-10-CM | POA: Diagnosis not present

## 2017-06-30 DIAGNOSIS — D485 Neoplasm of uncertain behavior of skin: Secondary | ICD-10-CM | POA: Diagnosis not present

## 2017-07-14 DIAGNOSIS — R7301 Impaired fasting glucose: Secondary | ICD-10-CM | POA: Diagnosis not present

## 2017-07-20 DIAGNOSIS — E6609 Other obesity due to excess calories: Secondary | ICD-10-CM | POA: Diagnosis not present

## 2017-07-20 DIAGNOSIS — E1169 Type 2 diabetes mellitus with other specified complication: Secondary | ICD-10-CM | POA: Diagnosis not present

## 2017-07-20 DIAGNOSIS — R52 Pain, unspecified: Secondary | ICD-10-CM | POA: Diagnosis not present

## 2017-07-20 DIAGNOSIS — E782 Mixed hyperlipidemia: Secondary | ICD-10-CM | POA: Diagnosis not present

## 2017-07-20 DIAGNOSIS — Z6832 Body mass index (BMI) 32.0-32.9, adult: Secondary | ICD-10-CM | POA: Diagnosis not present

## 2017-07-20 DIAGNOSIS — R945 Abnormal results of liver function studies: Secondary | ICD-10-CM | POA: Diagnosis not present

## 2017-07-20 DIAGNOSIS — Z0000 Encounter for general adult medical examination without abnormal findings: Secondary | ICD-10-CM | POA: Diagnosis not present

## 2017-07-27 ENCOUNTER — Other Ambulatory Visit (HOSPITAL_COMMUNITY): Payer: Self-pay | Admitting: Internal Medicine

## 2017-07-27 DIAGNOSIS — Z1231 Encounter for screening mammogram for malignant neoplasm of breast: Secondary | ICD-10-CM

## 2017-07-29 ENCOUNTER — Ambulatory Visit (HOSPITAL_COMMUNITY)
Admission: RE | Admit: 2017-07-29 | Discharge: 2017-07-29 | Disposition: A | Discharge status: Home / Self Care | Payer: 59 | Source: Ambulatory Visit | Attending: Internal Medicine | Admitting: Internal Medicine

## 2017-07-29 ENCOUNTER — Encounter (HOSPITAL_COMMUNITY): Payer: Self-pay

## 2017-07-29 ENCOUNTER — Ambulatory Visit (HOSPITAL_COMMUNITY): Payer: Self-pay

## 2017-07-29 DIAGNOSIS — Z1231 Encounter for screening mammogram for malignant neoplasm of breast: Secondary | ICD-10-CM | POA: Diagnosis not present

## 2017-08-15 DIAGNOSIS — H524 Presbyopia: Secondary | ICD-10-CM | POA: Diagnosis not present

## 2017-08-15 DIAGNOSIS — H52223 Regular astigmatism, bilateral: Secondary | ICD-10-CM | POA: Diagnosis not present

## 2017-08-15 DIAGNOSIS — H5213 Myopia, bilateral: Secondary | ICD-10-CM | POA: Diagnosis not present

## 2017-10-24 DIAGNOSIS — E782 Mixed hyperlipidemia: Secondary | ICD-10-CM | POA: Diagnosis not present

## 2017-10-24 DIAGNOSIS — E6609 Other obesity due to excess calories: Secondary | ICD-10-CM | POA: Diagnosis not present

## 2017-10-24 DIAGNOSIS — Z6832 Body mass index (BMI) 32.0-32.9, adult: Secondary | ICD-10-CM | POA: Diagnosis not present

## 2017-10-24 DIAGNOSIS — Z0000 Encounter for general adult medical examination without abnormal findings: Secondary | ICD-10-CM | POA: Diagnosis not present

## 2017-10-24 DIAGNOSIS — R52 Pain, unspecified: Secondary | ICD-10-CM | POA: Diagnosis not present

## 2017-10-24 DIAGNOSIS — E1169 Type 2 diabetes mellitus with other specified complication: Secondary | ICD-10-CM | POA: Diagnosis not present

## 2017-10-24 DIAGNOSIS — R945 Abnormal results of liver function studies: Secondary | ICD-10-CM | POA: Diagnosis not present

## 2017-11-02 DIAGNOSIS — Z6829 Body mass index (BMI) 29.0-29.9, adult: Secondary | ICD-10-CM | POA: Diagnosis not present

## 2017-11-02 DIAGNOSIS — M791 Myalgia, unspecified site: Secondary | ICD-10-CM | POA: Diagnosis not present

## 2017-11-02 DIAGNOSIS — E6609 Other obesity due to excess calories: Secondary | ICD-10-CM | POA: Diagnosis not present

## 2017-11-02 DIAGNOSIS — E1165 Type 2 diabetes mellitus with hyperglycemia: Secondary | ICD-10-CM | POA: Diagnosis not present

## 2017-11-02 DIAGNOSIS — E782 Mixed hyperlipidemia: Secondary | ICD-10-CM | POA: Diagnosis not present

## 2017-11-02 DIAGNOSIS — R945 Abnormal results of liver function studies: Secondary | ICD-10-CM | POA: Diagnosis not present

## 2017-12-09 DIAGNOSIS — M791 Myalgia, unspecified site: Secondary | ICD-10-CM | POA: Diagnosis not present

## 2017-12-09 DIAGNOSIS — M7712 Lateral epicondylitis, left elbow: Secondary | ICD-10-CM | POA: Diagnosis not present

## 2018-03-09 DIAGNOSIS — E1165 Type 2 diabetes mellitus with hyperglycemia: Secondary | ICD-10-CM | POA: Diagnosis not present

## 2018-03-09 DIAGNOSIS — R945 Abnormal results of liver function studies: Secondary | ICD-10-CM | POA: Diagnosis not present

## 2018-03-09 DIAGNOSIS — E782 Mixed hyperlipidemia: Secondary | ICD-10-CM | POA: Diagnosis not present

## 2018-03-09 DIAGNOSIS — Z6832 Body mass index (BMI) 32.0-32.9, adult: Secondary | ICD-10-CM | POA: Diagnosis not present

## 2018-03-21 DIAGNOSIS — E669 Obesity, unspecified: Secondary | ICD-10-CM | POA: Diagnosis not present

## 2018-03-21 DIAGNOSIS — J019 Acute sinusitis, unspecified: Secondary | ICD-10-CM | POA: Diagnosis not present

## 2018-03-21 DIAGNOSIS — E1169 Type 2 diabetes mellitus with other specified complication: Secondary | ICD-10-CM | POA: Diagnosis not present

## 2018-03-21 DIAGNOSIS — R945 Abnormal results of liver function studies: Secondary | ICD-10-CM | POA: Diagnosis not present

## 2018-03-21 DIAGNOSIS — R52 Pain, unspecified: Secondary | ICD-10-CM | POA: Diagnosis not present

## 2018-03-21 DIAGNOSIS — E782 Mixed hyperlipidemia: Secondary | ICD-10-CM | POA: Diagnosis not present

## 2018-03-21 DIAGNOSIS — J309 Allergic rhinitis, unspecified: Secondary | ICD-10-CM | POA: Diagnosis not present

## 2018-03-24 ENCOUNTER — Other Ambulatory Visit (HOSPITAL_COMMUNITY): Payer: Self-pay | Admitting: Internal Medicine

## 2018-03-24 DIAGNOSIS — Z780 Asymptomatic menopausal state: Secondary | ICD-10-CM

## 2018-03-28 ENCOUNTER — Other Ambulatory Visit (HOSPITAL_COMMUNITY): Payer: Self-pay

## 2018-06-26 DIAGNOSIS — M545 Low back pain: Secondary | ICD-10-CM | POA: Diagnosis not present

## 2018-09-14 ENCOUNTER — Other Ambulatory Visit (HOSPITAL_COMMUNITY): Payer: Self-pay | Admitting: Internal Medicine

## 2018-09-14 DIAGNOSIS — Z1231 Encounter for screening mammogram for malignant neoplasm of breast: Secondary | ICD-10-CM

## 2018-09-19 DIAGNOSIS — Z20828 Contact with and (suspected) exposure to other viral communicable diseases: Secondary | ICD-10-CM | POA: Diagnosis not present

## 2018-09-29 ENCOUNTER — Ambulatory Visit (HOSPITAL_COMMUNITY): Payer: 59

## 2018-12-07 ENCOUNTER — Other Ambulatory Visit: Payer: Self-pay

## 2018-12-07 ENCOUNTER — Ambulatory Visit (HOSPITAL_COMMUNITY)
Admission: RE | Admit: 2018-12-07 | Discharge: 2018-12-07 | Disposition: A | Discharge status: Home / Self Care | Payer: 59 | Source: Ambulatory Visit | Attending: Internal Medicine | Admitting: Internal Medicine

## 2018-12-07 DIAGNOSIS — Z1231 Encounter for screening mammogram for malignant neoplasm of breast: Secondary | ICD-10-CM | POA: Diagnosis not present

## 2019-01-19 DIAGNOSIS — H52223 Regular astigmatism, bilateral: Secondary | ICD-10-CM | POA: Diagnosis not present

## 2019-01-19 DIAGNOSIS — H524 Presbyopia: Secondary | ICD-10-CM | POA: Diagnosis not present

## 2019-01-19 DIAGNOSIS — H2513 Age-related nuclear cataract, bilateral: Secondary | ICD-10-CM | POA: Diagnosis not present

## 2019-01-19 DIAGNOSIS — E119 Type 2 diabetes mellitus without complications: Secondary | ICD-10-CM | POA: Diagnosis not present

## 2019-01-19 DIAGNOSIS — H5213 Myopia, bilateral: Secondary | ICD-10-CM | POA: Diagnosis not present

## 2019-01-30 ENCOUNTER — Emergency Department (HOSPITAL_COMMUNITY): Payer: 59

## 2019-01-30 ENCOUNTER — Other Ambulatory Visit: Payer: Self-pay

## 2019-01-30 ENCOUNTER — Emergency Department (HOSPITAL_COMMUNITY)
Admission: EM | Admit: 2019-01-30 | Discharge: 2019-01-31 | Disposition: A | Discharge status: Home / Self Care | Payer: 59 | Attending: Emergency Medicine | Admitting: Emergency Medicine

## 2019-01-30 ENCOUNTER — Encounter (HOSPITAL_COMMUNITY): Payer: Self-pay | Admitting: Emergency Medicine

## 2019-01-30 DIAGNOSIS — R509 Fever, unspecified: Secondary | ICD-10-CM | POA: Diagnosis not present

## 2019-01-30 DIAGNOSIS — U071 COVID-19: Secondary | ICD-10-CM | POA: Diagnosis not present

## 2019-01-30 DIAGNOSIS — R0602 Shortness of breath: Secondary | ICD-10-CM | POA: Diagnosis not present

## 2019-01-30 LAB — CBC WITH DIFFERENTIAL/PLATELET
Abs Immature Granulocytes: 0.04 10*3/uL (ref 0.00–0.07)
Basophils Absolute: 0 10*3/uL (ref 0.0–0.1)
Basophils Relative: 0 %
Eosinophils Absolute: 0 10*3/uL (ref 0.0–0.5)
Eosinophils Relative: 0 %
HCT: 39.7 % (ref 36.0–46.0)
Hemoglobin: 13.4 g/dL (ref 12.0–15.0)
Immature Granulocytes: 1 %
Lymphocytes Relative: 21 %
Lymphs Abs: 1.6 10*3/uL (ref 0.7–4.0)
MCH: 30.4 pg (ref 26.0–34.0)
MCHC: 33.8 g/dL (ref 30.0–36.0)
MCV: 90 fL (ref 80.0–100.0)
Monocytes Absolute: 0.5 10*3/uL (ref 0.1–1.0)
Monocytes Relative: 7 %
Neutro Abs: 5.3 10*3/uL (ref 1.7–7.7)
Neutrophils Relative %: 71 %
Platelets: 122 10*3/uL — ABNORMAL LOW (ref 150–400)
RBC: 4.41 MIL/uL (ref 3.87–5.11)
RDW: 12.3 % (ref 11.5–15.5)
WBC: 7.4 10*3/uL (ref 4.0–10.5)
nRBC: 0 % (ref 0.0–0.2)

## 2019-01-30 LAB — BASIC METABOLIC PANEL
Anion gap: 11 (ref 5–15)
BUN: 7 mg/dL — ABNORMAL LOW (ref 8–23)
CO2: 22 mmol/L (ref 22–32)
Calcium: 8.5 mg/dL — ABNORMAL LOW (ref 8.9–10.3)
Chloride: 105 mmol/L (ref 98–111)
Creatinine, Ser: 0.54 mg/dL (ref 0.44–1.00)
GFR calc Af Amer: >60 mL/min (ref >60)
GFR calc non Af Amer: >60 mL/min (ref >60)
Glucose, Bld: 119 mg/dL — ABNORMAL HIGH (ref 70–99)
Potassium: 3.5 mmol/L (ref 3.5–5.1)
Sodium: 138 mmol/L (ref 135–145)

## 2019-01-30 NOTE — ED Triage Notes (Signed)
Pt reports fever (102.7 at 1800) x 3 days, pt reports she is COVID+ on 01/22/2019, pt reports alternating ibuprofen and tylenol with no relief, temp in triage 99.7

## 2019-01-31 LAB — URINALYSIS, ROUTINE W REFLEX MICROSCOPIC
Bilirubin Urine: NEGATIVE
Glucose, UA: NEGATIVE mg/dL
Ketones, ur: NEGATIVE mg/dL
Nitrite: NEGATIVE
Protein, ur: NEGATIVE mg/dL
Specific Gravity, Urine: 1.012 (ref 1.005–1.030)
pH: 6 (ref 5.0–8.0)

## 2019-01-31 NOTE — ED Provider Notes (Signed)
[facility] EMERGENCY DEPARTMENT Provider Note   CSN: [id number] Arrival date & time: 01/30/19  2012     History Chief Complaint  Patient presents with  . Fever    Lauren Hansen is a 63 y.o. 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 She has been taking tylenol and ibuprofen prn, states she took a dose of tylenol this am, then an hour later took ibuprofen as she still felt febrile.  She reports nonproductive cough, denies sob or cp, reports generalized fatigue and myalgias.  She has been able to tolerate fluid intake and denies any reduced urination, also no n/v/d. Has had reduced appetite.   The history is provided by the patient.       Past Medical History:  Diagnosis Date  . 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 Atypical nevus 06/30/2017   Left Scapula-Moderate  . Cervical radiculopathy   . Chronic neck and back pain   . Dysplastic nevus 07/24/2001   Right Abdomen  . Lumbar radiculopathy   . Plantar fasciitis     Patient Active Problem List   Diagnosis Date Noted  . ALLERGIC RHINITIS 06/03/2008  . CELLULITIS, THIGH, RIGHT 06/03/2008  . OBESITY 07/20/2007  . HYPERLIPIDEMIA 01/16/2007  . CARPAL TUNNEL SYNDROME 01/16/2007  . NECK PAIN 01/16/2007  . BACK PAIN 01/16/2007  . NEPHROLITHIASIS, HX OF 01/16/2007    Past Surgical History:  Procedure Laterality Date  . ABDOMINAL HYSTERECTOMY    . APPENDECTOMY    . COLONOSCOPY N/A 05/11/2012   Procedure: COLONOSCOPY;  Surgeon: Rogene Houston, MD;  Location: [facility];  Service: Endoscopy;  Laterality: N/A;  1030  . right hand surg        OB History   No obstetric history on file.     No family history on file.  Social History   Tobacco Use  . Smoking status: Never Smoker  Substance Use Topics  . Alcohol use: Yes    Comment: social use  . Drug use:  No    Home Medications Prior to Admission medications   Medication Sig Start Date End Date Taking? Authorizing Provider  acetaminophen (TYLENOL) 500 MG tablet Take 500 mg by mouth every 6 (six) hours as needed.   Yes [provider]  ibuprofen (ADVIL) 200 MG tablet Take 200 mg by mouth every 6 (six) hours as needed.   Yes [provider]    Allergies    Patient has no known allergies.  Review of Systems   Review of Systems  Constitutional: Positive for appetite change, chills and fever.  HENT: Negative for congestion, ear pain, rhinorrhea, sinus pressure, sore throat, trouble swallowing and voice change.   Eyes: Negative for discharge.  Respiratory: Positive for cough. Negative for shortness of breath, wheezing and stridor.   Cardiovascular: Negative for chest pain.  Gastrointestinal: Negative for abdominal pain, nausea and vomiting.  Genitourinary: Negative.  Negative for decreased urine volume.  Musculoskeletal: Positive for myalgias.    Physical Exam Updated Vital Signs BP (!) 123/49   Pulse 70   Temp 98.2 F (36.8 C)   Resp 16   Ht 5\' 6"  (1.676 m)   Wt 86.2 kg   SpO2 94%   BMI 30.67 kg/m   Physical Exam Vitals and nursing note reviewed.  Constitutional:      Appearance: She is  well-developed.  HENT:     Head: Normocephalic and atraumatic.     Nose: No congestion or rhinorrhea.     Mouth/Throat:     Mouth: Mucous membranes are moist.  Eyes:     Conjunctiva/sclera: Conjunctivae normal.  Cardiovascular:     Rate and Rhythm: Normal rate and regular rhythm.     Heart sounds: Normal heart sounds.  Pulmonary:     Effort: Pulmonary effort is normal. No respiratory distress.     Breath sounds: Normal breath sounds. No wheezing or rhonchi.  Abdominal:     General: Bowel sounds are normal. There is no distension.     Palpations: Abdomen is soft.     Tenderness: There is no abdominal tenderness. There is no guarding.  Musculoskeletal:         General: Normal range of motion.     Cervical back: Normal range of motion.  Skin:    General: Skin is warm and dry.  Neurological:     Mental Status: She is alert.     ED Results / Procedures / Treatments   Labs (all labs ordered are listed, but only abnormal results are displayed) Labs Reviewed  CBC WITH DIFFERENTIAL/PLATELET - Abnormal; Notable for the following components:      Result Value   Platelets 122 (*)    All other components within normal limits  BASIC METABOLIC PANEL - Abnormal; Notable for the following components:   Glucose, Bld 119 (*)    BUN 7 (*)    Calcium 8.5 (*)    All other components within normal limits  URINALYSIS, ROUTINE W REFLEX MICROSCOPIC - Abnormal; Notable for the following components:   APPearance HAZY (*)    Hgb urine dipstick SMALL (*)    Leukocytes,Ua SMALL (*)    Bacteria, UA FEW (*)    All other components within normal limits    EKG None  Radiology No results found.   DG Chest Portable 1 View  Result Date: 01/30/2019 CLINICAL DATA:  Shortness of breath. EXAM: PORTABLE CHEST 1 VIEW COMPARISON:  April 03, 2004 FINDINGS: The lungs appear hyperexpanded. There is subtle hazy airspace opacities bilaterally. There is no pneumothorax. No large pleural effusion. The heart size is normal. There is mild blunting of the costophrenic angles bilaterally. IMPRESSION: 1. Subtle hazy airspace opacities bilaterally could reflect developing infection. 2. Mild blunting of the costophrenic angles bilaterally may reflect small pleural effusions. Electronically Signed   By: Constance Holster M.D.   On: 01/30/2019 23:53    Procedures Procedures (including critical care time)  Medications Ordered in ED Medications - No data to display  ED Course  I have reviewed the triage vital signs and the nursing notes.  Pertinent labs & imaging results that were available during my care of the patient were reviewed by me and considered in my medical decision  making (see chart for details).    MDM Rules/Calculators/A&P                      Labs and imaging reviewed. Pt without significant clinical evidence for dehydration. CXR suggesting early covid changes.  No sob with normal lung exam without wheezing or tachypnea.  She was ambulated in dept without pulse ox changes.  Discussed home tx including spacing out tylenol/ibu for better fever control.  Discussed return precautions.    Lauren Hansen was evaluated in Emergency Department on 02/04/2019 for the symptoms described in the history of present illness. She was  evaluated in the context of the global COVID-19 pandemic, which necessitated consideration that the patient might be at risk for infection with the SARS-CoV-2 virus that causes COVID-19. Institutional protocols and algorithms that pertain to the evaluation of patients at risk for COVID-19 are in a state of rapid change based on information released by regulatory bodies including the CDC and federal and state organizations. These policies and algorithms were followed during the patient's care in the ED.  Final Clinical Impression(s) / ED Diagnoses Final diagnoses:  COVID-19  Fever due to COVID-19    Rx / DC Orders ED Discharge Orders    None       Landis Martins 02/04/19 [id number]    Veryl Speak, MD 02/07/19 [phone]

## 2019-01-31 NOTE — Discharge Instructions (Addendum)
Your labs and chest xray are reassuring. You do have signs of Covid infection on your chest xray which is to be expected, but you do not need additional medications for this.  Rest and continue to drink plenty of fluids.  I recommend alternating the tylenol and ibuprofen every 3 hours for more consistent fever control.  Return here if you develop any increased weakness or if you develop shortness of breath.

## 2019-02-05 DIAGNOSIS — U071 COVID-19: Secondary | ICD-10-CM | POA: Diagnosis not present

## 2019-02-13 DIAGNOSIS — E1169 Type 2 diabetes mellitus with other specified complication: Secondary | ICD-10-CM | POA: Diagnosis not present

## 2019-02-13 DIAGNOSIS — K2900 Acute gastritis without bleeding: Secondary | ICD-10-CM | POA: Diagnosis not present

## 2019-02-13 DIAGNOSIS — E1165 Type 2 diabetes mellitus with hyperglycemia: Secondary | ICD-10-CM | POA: Diagnosis not present

## 2019-02-13 DIAGNOSIS — U071 COVID-19: Secondary | ICD-10-CM | POA: Diagnosis not present

## 2019-02-13 DIAGNOSIS — R945 Abnormal results of liver function studies: Secondary | ICD-10-CM | POA: Diagnosis not present

## 2019-02-13 DIAGNOSIS — R0602 Shortness of breath: Secondary | ICD-10-CM | POA: Diagnosis not present

## 2019-02-13 DIAGNOSIS — E782 Mixed hyperlipidemia: Secondary | ICD-10-CM | POA: Diagnosis not present

## 2019-02-13 DIAGNOSIS — E669 Obesity, unspecified: Secondary | ICD-10-CM | POA: Diagnosis not present

## 2019-04-26 ENCOUNTER — Other Ambulatory Visit: Payer: Self-pay

## 2019-04-26 ENCOUNTER — Other Ambulatory Visit (HOSPITAL_COMMUNITY): Payer: Self-pay | Admitting: Internal Medicine

## 2019-04-26 ENCOUNTER — Ambulatory Visit (HOSPITAL_COMMUNITY)
Admission: RE | Admit: 2019-04-26 | Discharge: 2019-04-26 | Disposition: A | Discharge status: Home / Self Care | Payer: 59 | Source: Ambulatory Visit | Attending: Internal Medicine | Admitting: Internal Medicine

## 2019-04-26 DIAGNOSIS — R079 Chest pain, unspecified: Secondary | ICD-10-CM

## 2019-04-26 DIAGNOSIS — R002 Palpitations: Secondary | ICD-10-CM | POA: Diagnosis not present

## 2019-04-26 DIAGNOSIS — U071 COVID-19: Secondary | ICD-10-CM | POA: Diagnosis not present

## 2019-04-26 DIAGNOSIS — I209 Angina pectoris, unspecified: Secondary | ICD-10-CM | POA: Diagnosis not present

## 2019-04-26 MED FILL — EZETIMIBE 10 MG TABS: 10 | 90 days supply | Qty: 90 | Fill #0

## 2019-04-30 MED FILL — AMOX-CLAV 875-125 MG TABLET: 875-125 | 10 days supply | Qty: 20 | Fill #0

## 2019-04-30 MED FILL — METHYLPREDNISOLONE 4 MG TAB: 4 | 6 days supply | Qty: 21 | Fill #0

## 2019-05-16 ENCOUNTER — Encounter: Payer: Self-pay | Admitting: *Deleted

## 2019-05-17 ENCOUNTER — Ambulatory Visit (INDEPENDENT_AMBULATORY_CARE_PROVIDER_SITE_OTHER): Payer: 59 | Admitting: Cardiology

## 2019-05-17 ENCOUNTER — Other Ambulatory Visit: Payer: Self-pay

## 2019-05-17 ENCOUNTER — Encounter: Payer: Self-pay | Admitting: Cardiology

## 2019-05-17 VITALS — BP 130/78 | HR 66 | Ht 66.0 in | Wt 186.0 lb

## 2019-05-17 DIAGNOSIS — R079 Chest pain, unspecified: Secondary | ICD-10-CM

## 2019-05-17 DIAGNOSIS — R002 Palpitations: Secondary | ICD-10-CM

## 2019-05-17 NOTE — Patient Instructions (Signed)
Your physician recommends that you schedule a follow-up appointment in: AS NEEDED WITH DR BRANCH  Your physician recommends that you continue on your current medications as directed. Please refer to the Current Medication list given to you today.  Thank you for choosing [facility]!!    

## 2019-05-17 NOTE — Progress Notes (Signed)
Clinical Summary Lauren Hansen is a 64 y.o.female seen as new consult, referred by Dr Nevada Crane for the following medical problems.   1. Chest pain/palpitations - pains last month. Nonspecific pain left sided, would last a few minutes a time but reoccur. 2/10 in severity. Usually would occur with activity.  - no other associated symptoms - not positational.  - symptosm have resolved.  - would have some palpitations associated  - walking regularly, did heavy trails up to 1 hr at [facility].   2. COVID infection in Dec 2020    SH: works at [employer]      Past Medical History:  Diagnosis Date  . 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 Atypical nevus 06/30/2017   Left Scapula-Moderate  . Cervical radiculopathy   . Chronic neck and back pain   . Dysplastic nevus 07/24/2001   Right Abdomen  . Lumbar radiculopathy   . Plantar fasciitis      No Known Allergies   Current Outpatient Medications  Medication Sig Dispense Refill  . acetaminophen (TYLENOL) 500 MG tablet Take 500 mg by mouth every 6 (six) hours as needed.    Marland Kitchen ibuprofen (ADVIL) 200 MG tablet Take 200 mg by mouth every 6 (six) hours as needed.     No current facility-administered medications for this visit.     Past Surgical History:  Procedure Laterality Date  . ABDOMINAL HYSTERECTOMY    . APPENDECTOMY    . COLONOSCOPY N/A 05/11/2012   Procedure: COLONOSCOPY;  Surgeon: Rogene Houston, MD;  Location: [facility];  Service: Endoscopy;  Laterality: N/A;  1030  . right hand surg        No Known Allergies    No family history on file.   Social History Lauren Hansen reports that she has never smoked. She does not have any smokeless tobacco history on file. Lauren Hansen reports current alcohol use.   Review of Systems CONSTITUTIONAL: No weight loss, fever, chills, weakness or fatigue.  HEENT: Eyes: No visual loss, blurred vision, double vision or yellow sclerae.No hearing loss,  sneezing, congestion, runny nose or sore throat.  SKIN: No rash or itching.  CARDIOVASCULAR: per hpi RESPIRATORY: No shortness of breath, cough or sputum.  GASTROINTESTINAL: No anorexia, nausea, vomiting or diarrhea. No abdominal pain or blood.  GENITOURINARY: No burning on urination, no polyuria NEUROLOGICAL: No headache, dizziness, syncope, paralysis, ataxia, numbness or tingling in the extremities. No change in bowel or bladder control.  MUSCULOSKELETAL: No muscle, back pain, joint pain or stiffness.  LYMPHATICS: No enlarged nodes. No history of splenectomy.  PSYCHIATRIC: No history of depression or anxiety.  ENDOCRINOLOGIC: No reports of sweating, cold or heat intolerance. No polyuria or polydipsia.  Marland Kitchen   Physical Examination Today's Vitals   05/17/19 0904  BP: 130/78  Pulse: 66  SpO2: 98%  Weight: 186 lb (84.4 kg)  Height: 5\' 6"  (1.676 m)   Body mass index is 30.02 kg/m.  Gen: resting comfortably, no acute distress HEENT: no scleral icterus, pupils equal round and reactive, no palptable cervical adenopathy,  CV: RRR, no m/r/g, no jvd Resp: Clear to auscultation bilaterally GI: abdomen is soft, non-tender, non-distended, normal bowel sounds, no hepatosplenomegaly MSK: extremities are warm, no edema.  Skin: warm, no rash Neuro:  no focal deficits Psych: appropriate affect      Assessment and Plan  1. Chest pain/palpitations - atypical chest pain in a time frame shortly after COVID infection -  symptoms have since resolved. She is tolerating fairly high levels of activity without any exertional symptoms - do not see indication for ischemic testing at this time. Palpitations have resolved as well, no indication for home monitor - EKG today shows NSR - monitor at this time, she is to contact us if significant recurrence      Arnoldo Lenis, M.D.

## 2019-06-05 ENCOUNTER — Other Ambulatory Visit: Payer: Self-pay | Admitting: Internal Medicine

## 2019-06-05 ENCOUNTER — Other Ambulatory Visit: Payer: Self-pay

## 2019-06-05 ENCOUNTER — Other Ambulatory Visit (HOSPITAL_COMMUNITY): Payer: Self-pay | Admitting: Internal Medicine

## 2019-06-05 ENCOUNTER — Ambulatory Visit (HOSPITAL_COMMUNITY)
Admission: RE | Admit: 2019-06-05 | Discharge: 2019-06-05 | Disposition: A | Discharge status: Home / Self Care | Payer: 59 | Source: Ambulatory Visit | Attending: Internal Medicine | Admitting: Internal Medicine

## 2019-06-05 DIAGNOSIS — J019 Acute sinusitis, unspecified: Secondary | ICD-10-CM | POA: Diagnosis not present

## 2019-06-05 DIAGNOSIS — N200 Calculus of kidney: Secondary | ICD-10-CM | POA: Diagnosis not present

## 2019-06-05 DIAGNOSIS — R310 Gross hematuria: Secondary | ICD-10-CM | POA: Insufficient documentation

## 2019-06-05 DIAGNOSIS — E1165 Type 2 diabetes mellitus with hyperglycemia: Secondary | ICD-10-CM | POA: Diagnosis not present

## 2019-06-05 DIAGNOSIS — J309 Allergic rhinitis, unspecified: Secondary | ICD-10-CM | POA: Diagnosis not present

## 2019-06-05 DIAGNOSIS — M545 Low back pain: Secondary | ICD-10-CM | POA: Diagnosis not present

## 2019-06-05 DIAGNOSIS — E6609 Other obesity due to excess calories: Secondary | ICD-10-CM | POA: Diagnosis not present

## 2019-06-05 DIAGNOSIS — E1169 Type 2 diabetes mellitus with other specified complication: Secondary | ICD-10-CM | POA: Diagnosis not present

## 2019-06-05 DIAGNOSIS — R319 Hematuria, unspecified: Secondary | ICD-10-CM | POA: Diagnosis not present

## 2019-06-05 DIAGNOSIS — E782 Mixed hyperlipidemia: Secondary | ICD-10-CM | POA: Diagnosis not present

## 2019-06-05 DIAGNOSIS — M7712 Lateral epicondylitis, left elbow: Secondary | ICD-10-CM | POA: Diagnosis not present

## 2019-06-06 ENCOUNTER — Other Ambulatory Visit: Payer: Self-pay | Admitting: Internal Medicine

## 2019-06-12 ENCOUNTER — Other Ambulatory Visit: Payer: Self-pay | Admitting: Urology

## 2019-06-12 DIAGNOSIS — N201 Calculus of ureter: Secondary | ICD-10-CM | POA: Diagnosis not present

## 2019-06-12 DIAGNOSIS — R310 Gross hematuria: Secondary | ICD-10-CM | POA: Diagnosis not present

## 2019-06-25 ENCOUNTER — Other Ambulatory Visit (HOSPITAL_COMMUNITY)
Admission: RE | Admit: 2019-06-25 | Discharge: 2019-06-25 | Disposition: A | Discharge status: Home / Self Care | Payer: 59 | Source: Ambulatory Visit | Attending: Urology | Admitting: Urology

## 2019-06-25 DIAGNOSIS — Z20822 Contact with and (suspected) exposure to covid-19: Secondary | ICD-10-CM | POA: Insufficient documentation

## 2019-06-25 DIAGNOSIS — Z01812 Encounter for preprocedural laboratory examination: Secondary | ICD-10-CM | POA: Diagnosis not present

## 2019-06-25 LAB — SARS CORONAVIRUS 2 (TAT 6-24 HRS): SARS Coronavirus 2: NEGATIVE

## 2019-06-26 NOTE — Progress Notes (Signed)
Pt called and gave instructions to . Pt states did not have blue folder. Office called will call back, out to lunch.. Arrival time [phone]

## 2019-06-28 ENCOUNTER — Ambulatory Visit (HOSPITAL_COMMUNITY): Payer: 59

## 2019-06-28 ENCOUNTER — Encounter (HOSPITAL_BASED_OUTPATIENT_CLINIC_OR_DEPARTMENT_OTHER)
Admission: RE | Disposition: A | Discharge status: Home / Self Care | Payer: Self-pay | Source: Home / Self Care | Attending: Urology

## 2019-06-28 ENCOUNTER — Ambulatory Visit (HOSPITAL_BASED_OUTPATIENT_CLINIC_OR_DEPARTMENT_OTHER)
Admission: RE | Admit: 2019-06-28 | Discharge: 2019-06-28 | Disposition: A | Discharge status: Home / Self Care | Payer: 59 | Attending: Urology | Admitting: Urology

## 2019-06-28 DIAGNOSIS — Z79899 Other long term (current) drug therapy: Secondary | ICD-10-CM | POA: Insufficient documentation

## 2019-06-28 DIAGNOSIS — N202 Calculus of kidney with calculus of ureter: Secondary | ICD-10-CM | POA: Diagnosis not present

## 2019-06-28 DIAGNOSIS — Z86018 Personal history of other benign neoplasm: Secondary | ICD-10-CM | POA: Insufficient documentation

## 2019-06-28 DIAGNOSIS — N200 Calculus of kidney: Secondary | ICD-10-CM | POA: Diagnosis not present

## 2019-06-28 DIAGNOSIS — N135 Crossing vessel and stricture of ureter without hydronephrosis: Secondary | ICD-10-CM

## 2019-06-28 HISTORY — PX: EXTRACORPOREAL SHOCK WAVE LITHOTRIPSY: SHX1557

## 2019-06-28 SURGERY — LITHOTRIPSY, ESWL
Anesthesia: LOCAL | Laterality: Left

## 2019-06-28 MED ORDER — DIPHENHYDRAMINE HCL 25 MG PO CAPS
ORAL_CAPSULE | ORAL | Status: AC
  Filled 2019-06-28: qty 1

## 2019-06-28 MED ORDER — KETOROLAC TROMETHAMINE 10 MG PO TABS: 10.0000 mg | ORAL_TABLET | Freq: Four times a day (QID) | ORAL | 0 refills | Status: DC | PRN

## 2019-06-28 MED ORDER — DIPHENHYDRAMINE HCL 25 MG PO CAPS
25.0000 mg | ORAL_CAPSULE | ORAL | Status: AC
  Administered 2019-06-28: 25 mg via ORAL

## 2019-06-28 MED ORDER — SODIUM CHLORIDE 0.9 % IV SOLN: INTRAVENOUS | Status: DC

## 2019-06-28 MED ORDER — OXYCODONE-ACETAMINOPHEN 5-325 MG PO TABS: 1.0000 | ORAL_TABLET | Freq: Four times a day (QID) | ORAL | 0 refills | Status: DC | PRN

## 2019-06-28 MED ORDER — DIAZEPAM 5 MG PO TABS
10.0000 mg | ORAL_TABLET | ORAL | Status: AC
  Administered 2019-06-28: 10 mg via ORAL

## 2019-06-28 MED ORDER — SENNOSIDES-DOCUSATE SODIUM 8.6-50 MG PO TABS
1.0000 | ORAL_TABLET | Freq: Two times a day (BID) | ORAL | 0 refills | Status: DC
Start: 2019-06-28 — End: 2020-03-13

## 2019-06-28 MED ORDER — DIAZEPAM 5 MG PO TABS
ORAL_TABLET | ORAL | Status: AC
  Filled 2019-06-28: qty 2

## 2019-06-28 MED ORDER — TAMSULOSIN HCL 0.4 MG PO CAPS: 0.4000 mg | ORAL_CAPSULE | Freq: Every day | ORAL | 0 refills | Status: DC

## 2019-06-28 NOTE — Brief Op Note (Signed)
06/28/2019  9:47 AM  PATIENT:  Lauren Hansen  64 y.o. female  PRE-OPERATIVE DIAGNOSIS:  LEFT URETEROPELVIC JUNCTION STONE  POST-OPERATIVE DIAGNOSIS:  * No post-op diagnosis entered *  PROCEDURE:  Procedure(s): EXTRACORPOREAL SHOCK WAVE LITHOTRIPSY (ESWL) (Left)  SURGEON:  Surgeon(s) and Role:    * Alexis Frock, MD - Primary  PHYSICIAN ASSISTANT:   ASSISTANTS: none   ANESTHESIA:   MAC  EBL:  minimal   BLOOD ADMINISTERED:none  DRAINS: none   LOCAL MEDICATIONS USED:  NONE  SPECIMEN:  No Specimen  DISPOSITION OF SPECIMEN:  N/A  COUNTS:  YES  TOURNIQUET:  * No tourniquets in log *  DICTATION: .Note written in paper chart  PLAN OF CARE: Discharge to home after PACU  PATIENT DISPOSITION:  Short Stay   Delay start of Pharmacological VTE agent (>24hrs) due to surgical blood loss or risk of bleeding: not applicable

## 2019-06-28 NOTE — H&P (Signed)
Lauren Hansen is an 64 y.o. female.    Chief Complaint: Pre-Op LEFT Shockwave Lithotripsy  HPI:   1 - LEFT Renal Stone - 38mm left renal pelvis / UPJ area stone by imaing 2021 on eval hematuria. Stone is solitary, 1000HU, easily seen on KUB.  Today " Lauren Hansen" is seen to proceed with LEFT shockwave lithotripys for left renal stone. C19 screen negative. Most recent UA without infectious parameters.   Past Medical History:  Diagnosis Date  . 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 Atypical nevus 06/30/2017   Left Scapula-Moderate  . Cervical radiculopathy   . Chronic neck and back pain   . Dysplastic nevus 07/24/2001   Right Abdomen  . Lumbar radiculopathy   . Plantar fasciitis     Past Surgical History:  Procedure Laterality Date  . ABDOMINAL HYSTERECTOMY    . APPENDECTOMY    . COLONOSCOPY N/A 05/11/2012   Procedure: COLONOSCOPY;  Surgeon: Rogene Houston, MD;  Location: [facility];  Service: Endoscopy;  Laterality: N/A;  1030  . right hand surg       No family history on file. Social History:  reports that she has never smoked. She has never used smokeless tobacco. She reports current alcohol use. She reports that she does not use drugs.  Allergies: No Known Allergies  Medications Prior to Admission  Medication Sig Dispense Refill  . acetaminophen (TYLENOL) 500 MG tablet Take 500 mg by mouth every 6 (six) hours as needed.    . ezetimibe (ZETIA) 10 MG tablet Take 10 mg by mouth daily.    Marland Kitchen ibuprofen (ADVIL) 200 MG tablet Take 200 mg by mouth every 6 (six) hours as needed.      No results found for this or any previous visit (from the past 48 hour(s)). No results found.  Review of Systems  Constitutional: Negative for chills and fever.  Genitourinary: Positive for hematuria.  All other systems reviewed and are negative.   Height 5\' 6"  (1.676 m). Physical Exam  Constitutional: She appears well-developed.  HENT:  Head: Normocephalic.  Eyes:  Pupils are equal, round, and reactive to light.  Cardiovascular: Normal rate.  Respiratory: Effort normal.  GI: Soft.  Genitourinary:    Genitourinary Comments: No CVAT at present,.    Musculoskeletal:        General: Normal range of motion.     Cervical back: Normal range of motion.  Neurological: She is alert.  Skin: Skin is warm.     Assessment/Plan  Proceed as planned with LEFT shockwave lithotripsy. RIsks, benefits, alternatives, expected peri-treatment course discussed.   Alexis Frock, MD 06/28/2019, 6:55 AM

## 2019-06-28 NOTE — Discharge Instructions (Signed)
1 - You may have urinary urgency (bladder spasms), pass small stone fragments, and bloody urine on / off for up to 2 weeks. This is normal.  2 - Call MD or go to ER for fever >102, severe pain / nausea / vomiting not relieved by medications, or acute change in medical status      Lithotripsy, Care After This sheet gives you information about how to care for yourself after your procedure. Your health care provider may also give you more specific instructions. If you have problems or questions, contact your health care provider. What can I expect after the procedure? After the procedure, it is common to have:  Some blood in your urine. This should only last for a few days.  Soreness in your back, sides, or upper abdomen for a few days.  Blotches or bruises on your back where the pressure wave entered the skin.  Pain, discomfort, or nausea when pieces (fragments) of the kidney stone move through the tube that carries urine from the kidney to the bladder (ureter). Stone fragments may pass soon after the procedure, but they may continue to pass for up to 4-8 weeks. ? If you have severe pain or nausea, contact your health care provider. This may be caused by a large stone that was not broken up, and this may mean that you need more treatment.  Some pain or discomfort during urination.  Some pain or discomfort in the lower abdomen or (in men) at the base of the penis. Follow these instructions at home: Medicines  Take over-the-counter and prescription medicines only as told by your health care provider.  If you were prescribed an antibiotic medicine, take it as told by your health care provider. Do not stop taking the antibiotic even if you start to feel better.  Do not drive for 24 hours if you were given a medicine to help you relax (sedative).  Do not drive or use heavy machinery while taking prescription pain medicine. Eating and drinking      Drink enough water and fluids to  keep your urine clear or pale yellow. This helps any remaining pieces of the stone to pass. It can also help prevent new stones from forming.  Eat plenty of fresh fruits and vegetables.  Follow instructions from your health care provider about eating and drinking restrictions. You may be instructed: ? To reduce how much salt (sodium) you eat or drink. Check ingredients and nutrition facts on packaged foods and beverages. ? To reduce how much meat you eat.  Eat the recommended amount of calcium for your age and gender. Ask your health care provider how much calcium you should have. General instructions  Get plenty of rest.  Most people can resume normal activities 1-2 days after the procedure. Ask your health care provider what activities are safe for you.  Your health care provider may direct you to lie in a certain position (postural drainage) and tap firmly (percuss) over your kidney area to help stone fragments pass. Follow instructions as told by your health care provider.  If directed, strain all urine through the strainer that was provided by your health care provider. ? Keep all fragments for your health care provider to see. Any stones that are found may be sent to a medical lab for examination. The stone may be as small as a grain of salt.  Keep all follow-up visits as told by your health care provider. This is important. Contact a health care provider  if:  You have pain that is severe or does not get better with medicine.  You have nausea that is severe or does not go away.  You have blood in your urine longer than your health care provider told you to expect.  You have more blood in your urine.  You have pain during urination that does not go away.  You urinate more frequently than usual and this does not go away.  You develop a rash or any other possible signs of an allergic reaction. Get help right away if:  You have severe pain in your back, sides, or upper  abdomen.  You have severe pain while urinating.  Your urine is very dark red.  You have blood in your stool (feces).  You cannot pass any urine at all.  You feel a strong urge to urinate after emptying your bladder.  You have a fever or chills.  You develop shortness of breath, difficulty breathing, or chest pain.  You have severe nausea that leads to persistent vomiting.  You faint. Summary  After this procedure, it is common to have some pain, discomfort, or nausea when pieces (fragments) of the kidney stone move through the tube that carries urine from the kidney to the bladder (ureter). If this pain or nausea is severe, however, you should contact your health care provider.  Most people can resume normal activities 1-2 days after the procedure. Ask your health care provider what activities are safe for you.  Drink enough water and fluids to keep your urine clear or pale yellow. This helps any remaining pieces of the stone to pass, and it can help prevent new stones from forming.  If directed, strain your urine and keep all fragments for your health care provider to see. Fragments or stones may be as small as a grain of salt.  Get help right away if you have severe pain in your back, sides, or upper abdomen or have severe pain while urinating. This information is not intended to replace advice given to you by your health care provider. Make sure you discuss any questions you have with your health care provider. Document Revised: 05/15/2018 Document Reviewed: 12/24/2015 Elsevier Patient Education  [place] Instructions  Activity: Get plenty of rest for the remainder of the day. A responsible individual must stay with you for 24 hours following the procedure.  For the next 24 hours, DO NOT: -Drive a car -Paediatric nurse -Drink alcoholic beverages -Take any medication unless instructed by your physician -Make any legal decisions  or sign important papers.  Meals: Start with liquid foods such as gelatin or soup. Progress to regular foods as tolerated. Avoid greasy, spicy, heavy foods. If nausea and/or vomiting occur, drink only clear liquids until the nausea and/or vomiting subsides. Call your physician if vomiting continues.  Special Instructions/Symptoms: Your throat may feel dry or sore from the anesthesia or the breathing tube placed in your throat during surgery. If this causes discomfort, gargle with warm salt water. The discomfort should disappear within 24 hours.  If you had a scopolamine patch placed behind your ear for the management of post- operative nausea and/or vomiting:  1. The medication in the patch is effective for 72 hours, after which it should be removed.  Wrap patch in a tissue and discard in the trash. Wash hands thoroughly with soap and water. 2. You may remove the patch earlier than 72 hours if you experience  unpleasant side effects which may include dry mouth, dizziness or visual disturbances. 3. Avoid touching the patch. Wash your hands with soap and water after contact with the patch.

## 2019-07-10 DIAGNOSIS — N201 Calculus of ureter: Secondary | ICD-10-CM | POA: Diagnosis not present

## 2019-07-26 DIAGNOSIS — N201 Calculus of ureter: Secondary | ICD-10-CM | POA: Diagnosis not present

## 2019-08-09 DIAGNOSIS — M545 Low back pain: Secondary | ICD-10-CM | POA: Diagnosis not present

## 2019-08-09 DIAGNOSIS — E6609 Other obesity due to excess calories: Secondary | ICD-10-CM | POA: Diagnosis not present

## 2019-08-09 DIAGNOSIS — Z6829 Body mass index (BMI) 29.0-29.9, adult: Secondary | ICD-10-CM | POA: Diagnosis not present

## 2019-08-09 DIAGNOSIS — M7712 Lateral epicondylitis, left elbow: Secondary | ICD-10-CM | POA: Diagnosis not present

## 2019-08-09 DIAGNOSIS — E1165 Type 2 diabetes mellitus with hyperglycemia: Secondary | ICD-10-CM | POA: Diagnosis not present

## 2019-08-09 DIAGNOSIS — Z0000 Encounter for general adult medical examination without abnormal findings: Secondary | ICD-10-CM | POA: Diagnosis not present

## 2019-08-09 DIAGNOSIS — M791 Myalgia, unspecified site: Secondary | ICD-10-CM | POA: Diagnosis not present

## 2019-08-09 DIAGNOSIS — Z6832 Body mass index (BMI) 32.0-32.9, adult: Secondary | ICD-10-CM | POA: Diagnosis not present

## 2019-08-09 DIAGNOSIS — E1169 Type 2 diabetes mellitus with other specified complication: Secondary | ICD-10-CM | POA: Diagnosis not present

## 2019-08-13 DIAGNOSIS — Z8616 Personal history of COVID-19: Secondary | ICD-10-CM | POA: Diagnosis not present

## 2019-08-13 DIAGNOSIS — H6991 Unspecified Eustachian tube disorder, right ear: Secondary | ICD-10-CM | POA: Diagnosis not present

## 2019-08-13 DIAGNOSIS — E1169 Type 2 diabetes mellitus with other specified complication: Secondary | ICD-10-CM | POA: Diagnosis not present

## 2019-08-13 DIAGNOSIS — R945 Abnormal results of liver function studies: Secondary | ICD-10-CM | POA: Diagnosis not present

## 2019-08-13 DIAGNOSIS — E669 Obesity, unspecified: Secondary | ICD-10-CM | POA: Diagnosis not present

## 2019-08-13 DIAGNOSIS — R0602 Shortness of breath: Secondary | ICD-10-CM | POA: Diagnosis not present

## 2019-08-13 DIAGNOSIS — K2900 Acute gastritis without bleeding: Secondary | ICD-10-CM | POA: Diagnosis not present

## 2019-08-13 DIAGNOSIS — N200 Calculus of kidney: Secondary | ICD-10-CM | POA: Diagnosis not present

## 2019-08-13 DIAGNOSIS — E782 Mixed hyperlipidemia: Secondary | ICD-10-CM | POA: Diagnosis not present

## 2019-08-24 DIAGNOSIS — N201 Calculus of ureter: Secondary | ICD-10-CM | POA: Diagnosis not present

## 2019-09-14 MED FILL — NEXLIZET 180-10 MG TABS: 180-10 | 30 days supply | Qty: 30 | Fill #0

## 2019-11-29 DIAGNOSIS — J019 Acute sinusitis, unspecified: Secondary | ICD-10-CM | POA: Diagnosis not present

## 2019-11-29 DIAGNOSIS — U071 COVID-19: Secondary | ICD-10-CM | POA: Diagnosis not present

## 2019-11-29 DIAGNOSIS — N201 Calculus of ureter: Secondary | ICD-10-CM | POA: Diagnosis not present

## 2019-11-29 DIAGNOSIS — E1165 Type 2 diabetes mellitus with hyperglycemia: Secondary | ICD-10-CM | POA: Diagnosis not present

## 2019-11-29 DIAGNOSIS — J309 Allergic rhinitis, unspecified: Secondary | ICD-10-CM | POA: Diagnosis not present

## 2019-11-29 DIAGNOSIS — R0602 Shortness of breath: Secondary | ICD-10-CM | POA: Diagnosis not present

## 2019-11-29 DIAGNOSIS — Z712 Person consulting for explanation of examination or test findings: Secondary | ICD-10-CM | POA: Diagnosis not present

## 2019-11-29 DIAGNOSIS — E669 Obesity, unspecified: Secondary | ICD-10-CM | POA: Diagnosis not present

## 2019-11-29 DIAGNOSIS — M791 Myalgia, unspecified site: Secondary | ICD-10-CM | POA: Diagnosis not present

## 2019-11-29 DIAGNOSIS — M7712 Lateral epicondylitis, left elbow: Secondary | ICD-10-CM | POA: Diagnosis not present

## 2019-12-03 DIAGNOSIS — R0602 Shortness of breath: Secondary | ICD-10-CM | POA: Diagnosis not present

## 2019-12-03 DIAGNOSIS — H6991 Unspecified Eustachian tube disorder, right ear: Secondary | ICD-10-CM | POA: Diagnosis not present

## 2019-12-03 DIAGNOSIS — K2900 Acute gastritis without bleeding: Secondary | ICD-10-CM | POA: Diagnosis not present

## 2019-12-03 DIAGNOSIS — Z8616 Personal history of COVID-19: Secondary | ICD-10-CM | POA: Diagnosis not present

## 2019-12-03 DIAGNOSIS — E782 Mixed hyperlipidemia: Secondary | ICD-10-CM | POA: Diagnosis not present

## 2019-12-03 DIAGNOSIS — E1169 Type 2 diabetes mellitus with other specified complication: Secondary | ICD-10-CM | POA: Diagnosis not present

## 2019-12-03 DIAGNOSIS — N200 Calculus of kidney: Secondary | ICD-10-CM | POA: Diagnosis not present

## 2019-12-03 DIAGNOSIS — R945 Abnormal results of liver function studies: Secondary | ICD-10-CM | POA: Diagnosis not present

## 2019-12-03 DIAGNOSIS — E669 Obesity, unspecified: Secondary | ICD-10-CM | POA: Diagnosis not present

## 2019-12-25 DIAGNOSIS — S91342A Puncture wound with foreign body, left foot, initial encounter: Secondary | ICD-10-CM | POA: Diagnosis not present

## 2019-12-25 DIAGNOSIS — E1169 Type 2 diabetes mellitus with other specified complication: Secondary | ICD-10-CM | POA: Diagnosis not present

## 2019-12-25 DIAGNOSIS — Z23 Encounter for immunization: Secondary | ICD-10-CM | POA: Diagnosis not present

## 2020-02-05 ENCOUNTER — Other Ambulatory Visit: Payer: Self-pay

## 2020-02-05 ENCOUNTER — Ambulatory Visit (INDEPENDENT_AMBULATORY_CARE_PROVIDER_SITE_OTHER): Payer: 59 | Admitting: Dermatology

## 2020-02-05 ENCOUNTER — Encounter: Payer: Self-pay | Admitting: Dermatology

## 2020-02-05 DIAGNOSIS — Z86018 Personal history of other benign neoplasm: Secondary | ICD-10-CM | POA: Diagnosis not present

## 2020-02-05 DIAGNOSIS — D485 Neoplasm of uncertain behavior of skin: Secondary | ICD-10-CM | POA: Diagnosis not present

## 2020-02-05 DIAGNOSIS — L821 Other seborrheic keratosis: Secondary | ICD-10-CM | POA: Diagnosis not present

## 2020-02-05 DIAGNOSIS — D2372 Other benign neoplasm of skin of left lower limb, including hip: Secondary | ICD-10-CM | POA: Diagnosis not present

## 2020-02-05 DIAGNOSIS — L918 Other hypertrophic disorders of the skin: Secondary | ICD-10-CM | POA: Diagnosis not present

## 2020-02-05 DIAGNOSIS — D239 Other benign neoplasm of skin, unspecified: Secondary | ICD-10-CM

## 2020-02-05 DIAGNOSIS — L570 Actinic keratosis: Secondary | ICD-10-CM | POA: Diagnosis not present

## 2020-02-05 DIAGNOSIS — L430 Hypertrophic lichen planus: Secondary | ICD-10-CM | POA: Diagnosis not present

## 2020-02-05 DIAGNOSIS — L720 Epidermal cyst: Secondary | ICD-10-CM

## 2020-02-05 DIAGNOSIS — Z1283 Encounter for screening for malignant neoplasm of skin: Secondary | ICD-10-CM

## 2020-02-05 NOTE — Patient Instructions (Signed)

## 2020-02-07 ENCOUNTER — Encounter: Payer: Self-pay | Admitting: Dermatology

## 2020-02-09 NOTE — Progress Notes (Signed)
Follow-Up Visit   Subjective  Lauren Hansen is a 64 y.o. female who presents for the following: Annual Exam (Face- where mask rubs).  General skin check Location: New spot right front shoulder Duration:  Quality:  Associated Signs/Symptoms: Modifying Factors:  Severity:  Timing: Context: History of atypical moles  Objective  Well appearing patient in no apparent distress; mood and affect are within normal limits. Objective  Right Abdomen (side) - Upper: Here for full body skin examination: No new or recurrent atypical moles.  Objective  Right Abdomen (side) - Lower: Multiple white scar- clear  Objective  Mid Back: Solitary 5 mm dermal papule, noninflamed.  Objective  Left Lower Leg - Anterior: Firm pink 5 mm dermal papule  Objective  Left Lower Back, Right Temple: Brown textured 5 mm flattopped papules  Objective  Right Abdomen (side) - Upper: Fleshy, skin-colored  pedunculated papule.    Objective  Neck - Anterior: Pink pearly 6 mm papule     Objective  Left Malar Cheek: 4 mm flat pink crust   A full examination was performed including scalp, head, eyes, ears, nose, lips, neck, chest, axillae, abdomen, back, buttocks, bilateral upper extremities, bilateral lower extremities, hands, feet, fingers, toes, fingernails, and toenails. All findings within normal limits unless otherwise noted below.   Assessment & Plan    Encounter for screening for malignant neoplasm of skin Right Abdomen (side) - Upper  Encouraged to self examine her skin twice annually, dermatologist yearly.  History of dysplastic nevus Right Abdomen (side) - Lower  Yearly skin check  Epidermal cyst Mid Back  No need for removal if stable  Dermatofibroma Left Lower Leg - Anterior  No need for removal if stable  Seborrheic keratosis (2) Left Lower Back; Right Temple  Okay to leave if stable  Skin tag Right Abdomen (side) - Upper  No need for removal  Neoplasm of  uncertain behavior of skin Neck - Anterior  Skin / nail biopsy Type of biopsy: tangential   Informed consent: discussed and consent obtained   Timeout: patient name, date of birth, surgical site, and procedure verified   Procedure prep:  Patient was prepped and draped in usual sterile fashion (Non sterile) Prep type:  Chlorhexidine Anesthesia: the lesion was anesthetized in a standard fashion   Anesthetic:  1% lidocaine w/ epinephrine 1-100,000 local infiltration Instrument used: flexible razor blade   Outcome: patient tolerated procedure well   Post-procedure details: wound care instructions given    Destruction of lesion Complexity: simple   Destruction method: electrodesiccation and curettage   Informed consent: discussed and consent obtained   Timeout:  patient name, date of birth, surgical site, and procedure verified Anesthesia: the lesion was anesthetized in a standard fashion   Anesthetic:  1% lidocaine w/ epinephrine 1-100,000 local infiltration Curettage performed in three different directions: Yes   Curettage cycles:  3 Margin per side (cm):  0.1 Final wound size (cm):  0.7 Hemostasis achieved with:  aluminum chloride Outcome: patient tolerated procedure well with no complications   Post-procedure details: wound care instructions given    Specimen 1 - Surgical pathology Differential Diagnosis: bcc vs scc  Check Margins: No  AK (actinic keratosis) Left Malar Cheek  Destruction of lesion - Left Malar Cheek Complexity: simple   Destruction method: cryotherapy   Informed consent: discussed and consent obtained   Timeout:  patient name, date of birth, surgical site, and procedure verified Lesion destroyed using liquid nitrogen: Yes   Cryotherapy cycles:  3  Outcome: patient tolerated procedure well with no complications       I, Lavonna Monarch, MD, have reviewed all documentation for this visit.  The documentation on 02/09/20 for the exam, diagnosis, procedures,  and orders are all accurate and complete.

## 2020-03-13 ENCOUNTER — Ambulatory Visit
Admission: EM | Admit: 2020-03-13 | Discharge: 2020-03-13 | Disposition: A | Discharge status: Home / Self Care | Payer: 59 | Attending: Emergency Medicine | Admitting: Emergency Medicine

## 2020-03-13 ENCOUNTER — Other Ambulatory Visit: Payer: Self-pay

## 2020-03-13 DIAGNOSIS — Z1152 Encounter for screening for COVID-19: Secondary | ICD-10-CM

## 2020-03-13 DIAGNOSIS — A084 Viral intestinal infection, unspecified: Secondary | ICD-10-CM | POA: Diagnosis not present

## 2020-03-13 MED ORDER — PANTOPRAZOLE SODIUM 20 MG PO TBEC
20.0000 mg | DELAYED_RELEASE_TABLET | Freq: Every day | ORAL | 0 refills | Status: DC
Start: 2020-03-13 — End: 2021-10-05

## 2020-03-13 MED ORDER — IBUPROFEN 600 MG PO TABS
600.0000 mg | ORAL_TABLET | Freq: Four times a day (QID) | ORAL | 0 refills | Status: DC | PRN
Start: 2020-03-13 — End: 2021-10-05

## 2020-03-13 MED ORDER — ONDANSETRON 8 MG PO TBDP
ORAL_TABLET | ORAL | 0 refills | Status: DC
Start: 2020-03-13 — End: 2021-10-05

## 2020-03-13 NOTE — Discharge Instructions (Addendum)
Zofran will help with your nausea and also slow down the diarrhea.  Push electrolyte containing fluids such as Pedialyte or Gatorade till your urine is clear.  600 mg of ibuprofen combined with 1000 mg of Tylenol together 3 -4 times a day as needed for pain.  Protonix may help

## 2020-03-13 NOTE — ED Triage Notes (Signed)
Pt presents with body aches, headache and diarrhea that began Sunday, 2 negative covid test both rapid and PCR

## 2020-03-13 NOTE — ED Provider Notes (Incomplete)
HPI  SUBJECTIVE:  Lauren Hansen is a 65 y.o. female who presents with over 10 episodes of watery, nonbloody diarrhea and constant dull, nonmigratory, nonradiating upper abdominal pain for the past 2 days.  She states that she had fevers to 101, body aches and headaches 5 days ago, but this has resolved.  No fevers or body aches with the diarrhea.  No change in urine output, increased thirst.  She reports nausea and one episode of emesis.  No abdominal pain.  She states that she gets very "gassy" with eating.  She took ibuprofen within 6 hours of evaluation.  She has been taking them with improvement in her symptoms.  Symptoms are worse with eating.  States that she needs a flu swab to return to work.  She states that she has a negative Covid antigen and PCR test over the past 5 days when she was having fevers, body aches and headaches.  She is status post appendectomy.  No history of diabetes, hypertension, pancreatitis, gallbladder disease.  She has a history of COVID with residual "stomach issues" that gets better with Protonix.  VOH:YWVP, Edwinna Areola, MD   Past Medical History:  Diagnosis Date  . 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 Atypical nevus 06/30/2017   Left Scapula-Moderate  . Cervical radiculopathy   . Chronic neck and back pain   . Dysplastic nevus 07/24/2001   Right Abdomen  . Lumbar radiculopathy   . Plantar fasciitis     Past Surgical History:  Procedure Laterality Date  . ABDOMINAL HYSTERECTOMY    . APPENDECTOMY    . COLONOSCOPY N/A 05/11/2012   Procedure: COLONOSCOPY;  Surgeon: Rogene Houston, MD;  Location: [facility];  Service: Endoscopy;  Laterality: N/A;  1030  . EXTRACORPOREAL SHOCK WAVE LITHOTRIPSY Left 06/28/2019   Procedure: EXTRACORPOREAL SHOCK WAVE LITHOTRIPSY (ESWL);  Surgeon: Alexis Frock, MD;  Location: [facility];  Service: Urology;  Laterality: Left;  . right hand surg       Family History  Family history  unknown: Yes    Social History   Tobacco Use  . Smoking status: Never Smoker  . Smokeless tobacco: Never Used  Substance Use Topics  . Alcohol use: Yes    Comment: social use  . Drug use: No    No current facility-administered medications for this encounter.  Current Outpatient Medications:  .  ibuprofen (ADVIL) 600 MG tablet, Take 1 tablet (600 mg total) by mouth every 6 (six) hours as needed., Disp: 30 tablet, Rfl: 0 .  ondansetron (ZOFRAN ODT) 8 MG disintegrating tablet, 1/2- 1 tablet q 8 hr prn nausea, vomiting, Disp: 20 tablet, Rfl: 0 .  pantoprazole (PROTONIX) 20 MG tablet, Take 1 tablet (20 mg total) by mouth daily., Disp: 30 tablet, Rfl: 0 .  ezetimibe (ZETIA) 10 MG tablet, Take 10 mg by mouth daily. (Patient not taking: Reported on 02/05/2020), Disp: , Rfl:   No Known Allergies   ROS  As noted in HPI.   Physical Exam  BP 127/80   Pulse 73   Temp 97.9 F (36.6 C)   Resp 18   SpO2 96%   Constitutional: Well developed, well nourished, no acute distress Eyes: PERRL, EOMI, conjunctiva normal bilaterally HENT: Normocephalic, atraumatic,mucus membranes moist Respiratory: Clear to auscultation bilaterally, no rales, no wheezing, no rhonchi Cardiovascular: Normal rate and rhythm, no murmurs, no gallops, no rubs.  Cap refill less than 2 seconds. GI: Soft, nondistended, hypoactive bowel sounds,  mild upper abdominal tenderness, no rebound, no guarding.  Negative Murphy. Back: no CVAT skin: No rash, skin intact Musculoskeletal: No edema, no tenderness, no deformities Neurologic: Alert & oriented x 3, CN III-XII grossly intact, no motor deficits, sensation grossly intact Psychiatric: Speech and behavior appropriate   ED Course   Medications - No data to display  Orders Placed This Encounter  Procedures  . Covid-19, Flu A+B ([facility])    Standing Status:   Standing    Number of Occurrences:   1   No results found for this or any previous visit (from the past 24  hour(s)). No results found.  ED Clinical Impression  1. Viral diarrhea   2. Encounter for screening for COVID-19      ED Assessment/Plan  Flu, COVID sent for work.  Suspect viral illness, could be COVID, causing symptoms.  Abdomen benign, she does not appear to be dehydrated.  She states that she had identical symptoms when she COVID last time.  Will send home with Zofran, push fluids.  She states that Protonix helped her last time this happened.  Will send home with Protonix 20 mg daily.  Also Tylenol/ibuprofen.  Follow-up with PMD as needed.  ER return precautions given.  Discussed MDM, treatment plan, and plan for follow-up with patient Discussed sn/sx that should prompt return to the ED. patient agrees with plan.   Meds ordered this encounter  Medications  . ondansetron (ZOFRAN ODT) 8 MG disintegrating tablet    Sig: 1/2- 1 tablet q 8 hr prn nausea, vomiting    Dispense:  20 tablet    Refill:  0  . ibuprofen (ADVIL) 600 MG tablet    Sig: Take 1 tablet (600 mg total) by mouth every 6 (six) hours as needed.    Dispense:  30 tablet    Refill:  0  . pantoprazole (PROTONIX) 20 MG tablet    Sig: Take 1 tablet (20 mg total) by mouth daily.    Dispense:  30 tablet    Refill:  0    *This clinic note was created using [employer]. Therefore, there may be occasional mistakes despite careful proofreading.  ?    Melynda Ripple, MD 03/14/20 [id number]    Melynda Ripple, MD 03/14/20 [phone]

## 2020-03-13 NOTE — ED Triage Notes (Signed)
Pt states body aches and headache improved, developed vomiting and diarrhea

## 2020-03-14 LAB — COVID-19, FLU A+B NAA
Influenza A, NAA: NOT DETECTED
Influenza B, NAA: NOT DETECTED
SARS-CoV-2, NAA: NOT DETECTED

## 2020-04-07 DIAGNOSIS — E1165 Type 2 diabetes mellitus with hyperglycemia: Secondary | ICD-10-CM | POA: Diagnosis not present

## 2020-04-07 DIAGNOSIS — Z712 Person consulting for explanation of examination or test findings: Secondary | ICD-10-CM | POA: Diagnosis not present

## 2020-04-07 DIAGNOSIS — U071 COVID-19: Secondary | ICD-10-CM | POA: Diagnosis not present

## 2020-04-07 DIAGNOSIS — M791 Myalgia, unspecified site: Secondary | ICD-10-CM | POA: Diagnosis not present

## 2020-04-07 DIAGNOSIS — M7712 Lateral epicondylitis, left elbow: Secondary | ICD-10-CM | POA: Diagnosis not present

## 2020-04-07 DIAGNOSIS — E669 Obesity, unspecified: Secondary | ICD-10-CM | POA: Diagnosis not present

## 2020-04-07 DIAGNOSIS — J019 Acute sinusitis, unspecified: Secondary | ICD-10-CM | POA: Diagnosis not present

## 2020-04-07 DIAGNOSIS — J309 Allergic rhinitis, unspecified: Secondary | ICD-10-CM | POA: Diagnosis not present

## 2020-04-07 DIAGNOSIS — R0602 Shortness of breath: Secondary | ICD-10-CM | POA: Diagnosis not present

## 2020-04-14 DIAGNOSIS — R7303 Prediabetes: Secondary | ICD-10-CM | POA: Diagnosis not present

## 2020-04-14 DIAGNOSIS — M5441 Lumbago with sciatica, right side: Secondary | ICD-10-CM | POA: Diagnosis not present

## 2020-04-14 DIAGNOSIS — E669 Obesity, unspecified: Secondary | ICD-10-CM | POA: Diagnosis not present

## 2020-04-14 DIAGNOSIS — E782 Mixed hyperlipidemia: Secondary | ICD-10-CM | POA: Diagnosis not present

## 2020-07-02 ENCOUNTER — Ambulatory Visit (INDEPENDENT_AMBULATORY_CARE_PROVIDER_SITE_OTHER): Payer: 59 | Admitting: Physician Assistant

## 2020-07-02 ENCOUNTER — Other Ambulatory Visit: Payer: Self-pay

## 2020-07-02 ENCOUNTER — Encounter: Payer: Self-pay | Admitting: Physician Assistant

## 2020-07-02 DIAGNOSIS — L814 Other melanin hyperpigmentation: Secondary | ICD-10-CM | POA: Diagnosis not present

## 2020-07-02 DIAGNOSIS — L821 Other seborrheic keratosis: Secondary | ICD-10-CM

## 2020-07-02 DIAGNOSIS — L570 Actinic keratosis: Secondary | ICD-10-CM | POA: Diagnosis not present

## 2020-07-02 DIAGNOSIS — L578 Other skin changes due to chronic exposure to nonionizing radiation: Secondary | ICD-10-CM | POA: Diagnosis not present

## 2020-07-02 DIAGNOSIS — D229 Melanocytic nevi, unspecified: Secondary | ICD-10-CM | POA: Diagnosis not present

## 2020-07-02 DIAGNOSIS — D1800 Hemangioma unspecified site: Secondary | ICD-10-CM | POA: Diagnosis not present

## 2020-07-02 DIAGNOSIS — Z86018 Personal history of other benign neoplasm: Secondary | ICD-10-CM

## 2020-07-02 DIAGNOSIS — Z1283 Encounter for screening for malignant neoplasm of skin: Secondary | ICD-10-CM | POA: Diagnosis not present

## 2020-07-02 NOTE — Progress Notes (Signed)
   New Patient   Subjective  Lauren Hansen is a 65 y.o. female who presents for the following: Annual Exam (Check Left cheek per patient previous biopsy irritated nevus. History of atypia in paper chart. Skin tags around neck).   The following portions of the chart were reviewed this encounter and updated as appropriate:  Tobacco  Allergies  Meds  Problems  Med Hx  Surg Hx  Fam Hx      Objective  Well appearing patient in no apparent distress; mood and affect are within normal limits.  A full examination was performed including scalp, head, eyes, ears, nose, lips, neck, chest, axillae, abdomen, back, buttocks, bilateral upper extremities, bilateral lower extremities, hands, feet, fingers, toes, fingernails, and toenails. All findings within normal limits unless otherwise noted below.  Objective  Left Parotid Area (2): Erythematous patches with gritty scale.   Assessment & Plan  AK (actinic keratosis) (2) Left Parotid Area  Destruction of lesion - Left Parotid Area Complexity: simple   Destruction method: cryotherapy   Informed consent: discussed and consent obtained   Timeout:  patient name, date of birth, surgical site, and procedure verified Lesion destroyed using liquid nitrogen: Yes   Cryotherapy cycles:  3 Outcome: patient tolerated procedure well with no complications   Post-procedure details: wound care instructions given     Lentigines - Scattered tan macules - Discussed due to sun exposure - Benign, observe - Call for any changes  Seborrheic Keratoses - Stuck-on, waxy, tan-brown papules and plaques  - Discussed benign etiology and prognosis. - Observe - Call for any changes  Melanocytic Nevi - Tan-brown and/or pink-flesh-colored symmetric macules and papules - Benign appearing on exam today - Observation - Call clinic for new or changing moles - Recommend daily use of broad spectrum spf 30+ sunscreen to sun-exposed areas.   Hemangiomas - Red  papules - Discussed benign nature - Observe - Call for any changes  Actinic Damage - diffuse scaly erythematous macules with underlying dyspigmentation - Recommend daily broad spectrum sunscreen SPF 30+ to sun-exposed areas, reapply every 2 hours as needed.  - Call for new or changing lesions.  Skin cancer screening performed today.    I, Adline Kirshenbaum, PA-C, have reviewed all documentation for this visit. The documentation on 07/02/20 for the exam, diagnosis, procedures, and orders are all accurate and complete.

## 2020-07-16 ENCOUNTER — Other Ambulatory Visit (HOSPITAL_COMMUNITY): Payer: Self-pay | Admitting: Family Medicine

## 2020-07-16 DIAGNOSIS — M25562 Pain in left knee: Secondary | ICD-10-CM

## 2020-07-16 DIAGNOSIS — M79641 Pain in right hand: Secondary | ICD-10-CM

## 2020-07-17 ENCOUNTER — Other Ambulatory Visit: Payer: Self-pay

## 2020-07-17 ENCOUNTER — Ambulatory Visit (HOSPITAL_COMMUNITY)
Admission: RE | Admit: 2020-07-17 | Discharge: 2020-07-17 | Disposition: A | Discharge status: Home / Self Care | Payer: 59 | Source: Ambulatory Visit | Attending: Family Medicine | Admitting: Family Medicine

## 2020-07-17 DIAGNOSIS — M79641 Pain in right hand: Secondary | ICD-10-CM

## 2020-07-17 DIAGNOSIS — M25562 Pain in left knee: Secondary | ICD-10-CM | POA: Diagnosis not present

## 2020-07-24 DIAGNOSIS — S86912A Strain of unspecified muscle(s) and tendon(s) at lower leg level, left leg, initial encounter: Secondary | ICD-10-CM | POA: Diagnosis not present

## 2020-07-24 DIAGNOSIS — M1712 Unilateral primary osteoarthritis, left knee: Secondary | ICD-10-CM | POA: Diagnosis not present

## 2020-08-25 DIAGNOSIS — M25562 Pain in left knee: Secondary | ICD-10-CM | POA: Diagnosis not present

## 2020-08-26 ENCOUNTER — Other Ambulatory Visit (HOSPITAL_COMMUNITY): Payer: Self-pay

## 2020-08-26 MED ORDER — MELOXICAM 7.5 MG PO TABS
7.5000 mg | ORAL_TABLET | Freq: Two times a day (BID) | ORAL | 2 refills | Status: DC
Start: 2020-07-24 — End: 2021-01-07
  Filled 2020-08-26: qty 60, 30d supply, fill #0
  Filled 2020-10-02: qty 60, 30d supply, fill #1
  Filled 2020-11-08: qty 60, 30d supply, fill #2

## 2020-09-02 DIAGNOSIS — M25562 Pain in left knee: Secondary | ICD-10-CM | POA: Diagnosis not present

## 2020-09-08 DIAGNOSIS — M1712 Unilateral primary osteoarthritis, left knee: Secondary | ICD-10-CM | POA: Diagnosis not present

## 2020-09-10 ENCOUNTER — Other Ambulatory Visit: Payer: Self-pay

## 2020-09-10 ENCOUNTER — Ambulatory Visit (HOSPITAL_COMMUNITY): Payer: 59 | Attending: Orthopedic Surgery | Admitting: Physical Therapy

## 2020-09-10 DIAGNOSIS — M25562 Pain in left knee: Secondary | ICD-10-CM | POA: Insufficient documentation

## 2020-09-10 DIAGNOSIS — G8929 Other chronic pain: Secondary | ICD-10-CM | POA: Insufficient documentation

## 2020-09-10 NOTE — Therapy (Signed)
[place], [place], [address] Phone: [phone]   Fax:  [phone]  Physical Therapy Evaluation  Patient Details  Name: Lauren Hansen MRN: [id number] Date of Birth: 1955-05-28 Referring Provider (PT): Carlyle Lipa   Encounter Date: 09/10/2020   PT End of Session - 09/10/20 1128     Visit Number 1    Number of Visits 8    Date for PT Re-Evaluation 10/10/20    Authorization Type [facility] [facility]    Progress Note Due on Visit 8    PT Start Time 1050    PT Stop Time 1130    PT Time Calculation (min) 40 min    Activity Tolerance Patient tolerated treatment well             Past Medical History:  Diagnosis Date   Atypical nevus 07/24/2001   Right Cheek-Slight to Moderate   Atypical nevus 06/30/2017   Left Scapula-Moderate   Cervical radiculopathy    Chronic neck and back pain    Dysplastic nevus 07/24/2001   Right Abdomen   Lumbar radiculopathy    Plantar fasciitis     Past Surgical History:  Procedure Laterality Date   ABDOMINAL HYSTERECTOMY     APPENDECTOMY     COLONOSCOPY N/A 05/11/2012   Procedure: COLONOSCOPY;  Surgeon: Rogene Houston, MD;  [facility];  Service: Endoscopy;  Laterality: N/A;  1030   EXTRACORPOREAL SHOCK WAVE LITHOTRIPSY Left 06/28/2019   Procedure: EXTRACORPOREAL SHOCK WAVE LITHOTRIPSY (ESWL);  Surgeon: Alexis Frock, MD;  Location: [facility];  Service: Urology;  Laterality: Left;   right hand surg       There were no vitals filed for this visit.    Subjective Assessment - 09/10/20 1057     Subjective Lauren Hansen states that her knee started out just being tight then she was just walking in June and she heard a "pop" in her knee and she could not move her leg. 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    How long can you sit comfortably? 15 minutes and then she wants to move her knee.    How long can you stand comfortably? 30 minutes    How long can you  walk comfortably? 30 minutes    Diagnostic tests MRI    Patient Stated Goals less pain; bend down    Currently in Pain? Yes    Pain Score 1    highest pain in the past week has been an 8   Pain Location Knee    Pain Orientation Left;Medial    Pain Descriptors / Indicators Aching    Pain Type Chronic pain    Pain Onset More than a month ago    Pain Frequency Constant    Aggravating Factors  weight bearing    Pain Relieving Factors meds    Effect of Pain on Daily Activities limits                [facility] PT Assessment - 09/10/20 0001       Assessment   Medical Diagnosis Right knee pain    Referring Provider (PT) Carlyle Lipa    Onset Date/Surgical Date 07/16/20   acute exacerbation   Next MD Visit 10/24/2020    Prior Therapy none      Precautions   Precautions None      Restrictions   Weight Bearing Restrictions No      Balance Screen   Has  the patient fallen in the past 6 months No    Has the patient had a decrease in activity level because of a fear of falling?  Yes      [place] residence    [place] to enter    Entrance Stairs-Number of Steps 1    [place] One level      Prior Function   Level of Independence Independent    Vocation Full time employment    [employer]      Cognition   Overall Cognitive Status Within Functional Limits for tasks assessed      Observation/Other Assessments   Focus on Therapeutic Outcomes (FOTO)  59; 41% affectecd      Functional Tests   Functional tests Single leg stance;Sit to Stand      Single Leg Stance   Comments RT:  60   ; LT 40"      Sit to Stand   Comments 8 in 30 seconds      ROM / Strength   AROM / PROM / Strength Strength;AROM      AROM   AROM Assessment Site Knee    Right/Left Knee Left    Left Knee Extension 8    Left Knee Flexion 108      Strength   Strength Assessment Site Knee;Hip    Right/Left Hip Right;Left    Right  Hip Flexion 5/5    Right Hip Extension 5/5    Right Hip ABduction 5/5    Left Hip Flexion 5/5    Left Hip Extension 4/5    Right/Left Knee Right;Left    Right Knee Extension 5/5    Left Knee Extension 4+/5      Flexibility   Soft Tissue Assessment /Muscle Length yes    Hamstrings Lt 140; Rt 155      Ambulation/Gait   Ambulation Distance (Feet) 420 Feet    Gait Comments 2 minute                        Objective measurements completed on examination: See above findings.       [facility] Adult PT Treatment/Exercise - 09/10/20 0001       Exercises   Exercises Knee/Hip      Knee/Hip Exercises: Stretches   Active Hamstring Stretch Left;3 reps;20 seconds      Knee/Hip Exercises: Supine   Quad Sets Left;10 reps    Heel Slides Left;10 reps                    PT Education - 09/10/20 1127     Education Details HEP    Person(s) Educated Patient    Methods Explanation;Verbal cues;Handout    Comprehension Verbalized understanding              PT Short Term Goals - 09/10/20 1235       PT SHORT TERM GOAL #1   Title PT to be I in HEP to allow pain in LT knee to be no greater than a 5/10 throughout the day to improve pt activity tolerance.    Time 2    Period Weeks    Status New    Target Date 09/24/20      PT SHORT TERM GOAL #2   Title PT to be able to stand/walk for an hour at a time without increased knee pain to assist in returning to work  Time 2    Period Weeks    Status New               PT Long Term Goals - 09/10/20 1237       PT LONG TERM GOAL #1   Title PT to be I in advance HEP to allow left knee pain to decrease to no greater than a 3/10 to improve functional ability.    Time 4    Period Weeks    Status New    Target Date 10/08/20      PT LONG TERM GOAL #2   Title PT LE strength to be 5/5 to be able to go up and down 5 steps in a reciprocal manner    Time 4    Period Weeks    Status New      PT LONG TERM GOAL #3    Title PT have returned to full days at work without noted increased knee pain.    Time 4    Period Weeks    Status New      PT LONG TERM GOAL #4   Title PT to be able to complete 14 sit to stand in 30 seconds to demonstrate improved function of left knee.    Time 4    Period Weeks    Status New                    Plan - 09/10/20 1229     Clinical Impression Statement Lauren Hansen is a 65 yo female who has increased left knee pain . There is no history of injury or trauma,she has been referred to skilled out-patient PT for evaluation and treatment by her orthopedist.  Evaluation demonstrates decreased ROM, decreased activity tolerance, decreased strength and increased pain.  Mr. Tamaki will benefit from skilled PT to address these issues and maximize her functional ability.    Examination-Activity Limitations Squat;Dressing;Lift;Locomotion Level;Stairs    Examination-Participation Restrictions Cleaning;Community Activity;Laundry;Occupation;Shop    Stability/Clinical Decision Making Stable/Uncomplicated    Clinical Decision Making Low    Rehab Potential Good    PT Frequency 2x / week    PT Duration 8 weeks    PT Treatment/Interventions Patient/family education;Therapeutic activities;Therapeutic exercise;Manual techniques    PT Next Visit Plan begin sit to stand, heel raises, functional squat, terminal extension both standing and supine manual if needed.  Continue to focus on strengthening of LT LE and improving hamstring length.             Patient will benefit from skilled therapeutic intervention in order to improve the following deficits and impairments:  Decreased activity tolerance, Decreased balance, Decreased strength, Difficulty walking, Pain  Visit Diagnosis: Chronic pain of left knee - Plan: PT plan of care cert/re-cert     Problem List Patient Active Problem List   Diagnosis Date Noted   ALLERGIC RHINITIS 06/03/2008   CELLULITIS, [place], RIGHT 06/03/2008    OBESITY 07/20/2007   HYPERLIPIDEMIA 01/16/2007   CARPAL TUNNEL SYNDROME 01/16/2007   NECK PAIN 01/16/2007   BACK PAIN 01/16/2007   NEPHROLITHIASIS, HX OF 01/16/2007  Rayetta Humphrey, PT CLT [phone]  09/10/2020, 12:42 PM  [place], [place], [address] Phone: [phone]   Fax:  [phone]  Name: Lauren Hansen MRN: [id number] Date of Birth: Sep 14, 1955

## 2020-09-17 ENCOUNTER — Other Ambulatory Visit: Payer: Self-pay

## 2020-09-17 ENCOUNTER — Ambulatory Visit (HOSPITAL_COMMUNITY): Payer: 59 | Attending: Orthopedic Surgery

## 2020-09-17 ENCOUNTER — Encounter (HOSPITAL_COMMUNITY): Payer: Self-pay

## 2020-09-17 DIAGNOSIS — M25562 Pain in left knee: Secondary | ICD-10-CM | POA: Insufficient documentation

## 2020-09-17 DIAGNOSIS — G8929 Other chronic pain: Secondary | ICD-10-CM | POA: Insufficient documentation

## 2020-09-17 NOTE — Patient Instructions (Signed)
Heel Raises    Stand with support. Tighten pelvic floor and hold. With knees straight, raise heels off ground. Hold 5 seconds.  Repeat 10 times. Do 2 times a day.  Copyright  VHI. All rights reserved.   Gastroc Stretch    Stand with right foot back, leg straight, forward leg bent. Keeping heel on floor, turned slightly out, lean into wall until stretch is felt in calf. Hold 30 seconds. Repeat 3 times per set. Do 2 sets per day.  http://orth.exer.us/27   Copyright  VHI. All rights reserved.

## 2020-09-17 NOTE — Therapy (Signed)
[facility] [address] [place], [place], [address] Phone: [phone]   Fax:  [phone]  Physical Therapy Treatment  Patient Details  Name: Lauren Hansen MRN: [id number] Date of Birth: 1955/04/26 Referring Provider (PT): Carlyle Lipa   Encounter Date: 09/17/2020   PT End of Session - 09/17/20 1054     Visit Number 2    Number of Visits 8    Date for PT Re-Evaluation 10/10/20    Authorization Type [facility]    Progress Note Due on Visit 8    PT Start Time 1050    PT Stop Time 1130    PT Time Calculation (min) 40 min    Activity Tolerance Patient tolerated treatment well    Behavior During Therapy [facility] for tasks assessed/performed             Past Medical History:  Diagnosis Date   Atypical nevus 07/24/2001   Right Cheek-Slight to Moderate   Atypical nevus 06/30/2017   Left Scapula-Moderate   Cervical radiculopathy    Chronic neck and back pain    Dysplastic nevus 07/24/2001   Right Abdomen   Lumbar radiculopathy    Plantar fasciitis     Past Surgical History:  Procedure Laterality Date   ABDOMINAL HYSTERECTOMY     APPENDECTOMY     COLONOSCOPY N/A 05/11/2012   Procedure: COLONOSCOPY;  Surgeon: Rogene Houston, MD;  [facility];  Service: Endoscopy;  Laterality: N/A;  1030   EXTRACORPOREAL SHOCK WAVE LITHOTRIPSY Left 06/28/2019   Procedure: EXTRACORPOREAL SHOCK WAVE LITHOTRIPSY (ESWL);  Surgeon: Alexis Frock, MD;  Location: [facility];  Service: Urology;  Laterality: Left;   right hand surg       There were no vitals filed for this visit.   Subjective Assessment - 09/17/20 1053     Subjective Pt stated she has began the HEP without question.  Pain scale 1-2/10 mainly tightness.    Patient Stated Goals less pain; bend down    Currently in Pain? Yes    Pain Score 2     Pain Location Knee    Pain Orientation Left;Medial    Pain Descriptors / Indicators Aching    Pain Type Chronic  pain    Pain Onset More than a month ago    Pain Frequency Constant    Aggravating Factors  weight bearing    Pain Relieving Factors meds    Effect of Pain on Daily Activities limits                               [facility] Adult PT Treatment/Exercise - 09/17/20 0001       Exercises   Exercises Knee/Hip      Knee/Hip Exercises: Stretches   Active Hamstring Stretch Left;3 reps;30 seconds    Active Hamstring Stretch Limitations supine then standing wiht 12in step height    Gastroc Stretch 3 reps;30 seconds    Gastroc Stretch Limitations slant board      Knee/Hip Exercises: Standing   Heel Raises 10 reps    Terminal Knee Extension Left;10 reps;Theraband    Theraband Level (Terminal Knee Extension) Level 3 (Green)      Knee/Hip Exercises: Seated   Long Arc Quad 10 reps    Sit to [employer] 5 reps;without UE support      Knee/Hip Exercises: Supine   Quad Sets Left;5 reps    Terminal Knee  Extension Left;10 reps    Terminal Knee Extension Limitations 5" holds with towel under knee    Straight Leg Raises 10 reps                    PT Education - 09/17/20 1058     Education Details Reviewed goals, educated importance of HEP compliance for maximal benefits, pt reports compliance of HEP and able to recall.    Person(s) Educated Patient    Methods Explanation    Comprehension Verbalized understanding              PT Short Term Goals - 09/10/20 1235       PT SHORT TERM GOAL #1   Title PT to be I in HEP to allow pain in LT knee to be no greater than a 5/10 throughout the day to improve pt activity tolerance.    Time 2    Period Weeks    Status New    Target Date 09/24/20      PT SHORT TERM GOAL #2   Title PT to be able to stand/walk for an hour at a time without increased knee pain to assist in returning to work    Time 2    Period Weeks    Status New               PT Long Term Goals - 09/10/20 1237       PT LONG TERM GOAL #1   Title  PT to be I in advance HEP to allow left knee pain to decrease to no greater than a 3/10 to improve functional ability.    Time 4    Period Weeks    Status New    Target Date 10/08/20      PT LONG TERM GOAL #2   Title PT LE strength to be 5/5 to be able to go up and down 5 steps in a reciprocal manner    Time 4    Period Weeks    Status New      PT LONG TERM GOAL #3   Title PT have returned to full days at work without noted increased knee pain.    Time 4    Period Weeks    Status New      PT LONG TERM GOAL #4   Title PT to be able to complete 14 sit to stand in 30 seconds to demonstrate improved function of left knee.    Time 4    Period Weeks    Status New                   Plan - 09/17/20 1100     Clinical Impression Statement Reviewed goals, educated importance of HEP complinace for maximal benefits, pt able to recall and demonstrate appropriate mechanics with exercise program.  Session focus on Lt knee ROM, LE strengthening and hamstring lengthening.  Pt able to complete all exercises with good form and control following initial cueing.  Does report some increased pain with movement, none at rest.  Reviewed RICE and ankle pumps techqniues for edema and pain control.    Examination-Activity Limitations Squat;Dressing;Lift;Locomotion Level;Stairs    Examination-Participation Restrictions Cleaning;Community Activity;Laundry;Occupation;Shop    Stability/Clinical Decision Making Stable/Uncomplicated    Clinical Decision Making Low    Rehab Potential Good    PT Frequency 2x / week    PT Duration 8 weeks    PT Treatment/Interventions Patient/family education;Therapeutic activities;Therapeutic exercise;Manual techniques  PT Next Visit Plan AROM, Progress quad strengthening and hamstring lengthening.  Manual as needed    Consulted and Agree with Plan of Care Patient             Patient will benefit from skilled therapeutic intervention in order to improve the  following deficits and impairments:  Decreased activity tolerance, Decreased balance, Decreased strength, Difficulty walking, Pain  Visit Diagnosis: Chronic pain of left knee     Problem List Patient Active Problem List   Diagnosis Date Noted   ALLERGIC RHINITIS 06/03/2008   CELLULITIS, [place], RIGHT 06/03/2008   OBESITY 07/20/2007   HYPERLIPIDEMIA 01/16/2007   CARPAL TUNNEL SYNDROME 01/16/2007   NECK PAIN 01/16/2007   BACK PAIN 01/16/2007   NEPHROLITHIASIS, HX OF 01/16/2007   Ihor Austin, LPTA/CLT; CBIS [phone]  Aldona Lento 09/17/2020, 11:31 AM  [place], [place], [address] Phone: [phone]   Fax:  [phone]  Name: Lauren Hansen MRN: [id number] Date of Birth: 23-Mar-1955

## 2020-09-19 ENCOUNTER — Encounter (HOSPITAL_COMMUNITY): Payer: 59 | Admitting: Physical Therapy

## 2020-09-24 ENCOUNTER — Other Ambulatory Visit: Payer: Self-pay

## 2020-09-24 ENCOUNTER — Ambulatory Visit (HOSPITAL_COMMUNITY): Payer: 59

## 2020-09-24 DIAGNOSIS — M25562 Pain in left knee: Secondary | ICD-10-CM

## 2020-09-24 DIAGNOSIS — G8929 Other chronic pain: Secondary | ICD-10-CM | POA: Diagnosis not present

## 2020-09-24 NOTE — Therapy (Signed)
[facility] [address] [place], [place], [address] Phone: [phone]   Fax:  [phone]  Physical Therapy Treatment  Patient Details  Name: Lauren Hansen MRN: [id number] Date of Birth: 03/17/1955 Referring Provider (PT): Carlyle Lipa   Encounter Date: 09/24/2020   PT End of Session - 09/24/20 0833     Visit Number 3    Number of Visits 8    Date for PT Re-Evaluation 10/10/20    Authorization Type [facility]    Progress Note Due on Visit 8    PT Start Time 0815    PT Stop Time 0900    PT Time Calculation (min) 45 min    Activity Tolerance Patient tolerated treatment well    Behavior During Therapy [facility] for tasks assessed/performed             Past Medical History:  Diagnosis Date   Atypical nevus 07/24/2001   Right Cheek-Slight to Moderate   Atypical nevus 06/30/2017   Left Scapula-Moderate   Cervical radiculopathy    Chronic neck and back pain    Dysplastic nevus 07/24/2001   Right Abdomen   Lumbar radiculopathy    Plantar fasciitis     Past Surgical History:  Procedure Laterality Date   ABDOMINAL HYSTERECTOMY     APPENDECTOMY     COLONOSCOPY N/A 05/11/2012   Procedure: COLONOSCOPY;  Surgeon: Rogene Houston, MD;  [facility];  Service: Endoscopy;  Laterality: N/A;  1030   EXTRACORPOREAL SHOCK WAVE LITHOTRIPSY Left 06/28/2019   Procedure: EXTRACORPOREAL SHOCK WAVE LITHOTRIPSY (ESWL);  Surgeon: Alexis Frock, MD;  Location: [facility];  Service: Urology;  Laterality: Left;   right hand surg       There were no vitals filed for this visit.   Subjective Assessment - 09/24/20 0837     Subjective Continued discomfort in left knee medial/lateral joint line and lateral knee with flexion    Patient Stated Goals less pain; bend down    Currently in Pain? Yes    Pain Score 2     Pain Location Knee    Pain Orientation Left;Medial;Lateral    Pain Descriptors / Indicators Aching    Pain  Type Chronic pain    Pain Onset More than a month ago                [facility] PT Assessment - 09/24/20 0001       Assessment   Medical Diagnosis Right knee pain    Referring Provider (PT) Carlyle Lipa    Onset Date/Surgical Date 07/16/20    Next MD Visit 10/24/2020                           [facility] Adult PT Treatment/Exercise - 09/24/20 0001       Knee/Hip Exercises: Standing   Gait Training 2x2 min backwards walking      Knee/Hip Exercises: Supine   Quad Sets Strengthening;Left;3 sets;10 reps    Quad Sets Limitations 3 sec    Terminal Knee Extension Left;10 reps    Terminal Knee Extension Limitations 5" holds with towel under knee    Straight Leg Raises Strengthening;Left;3 sets;10 reps      Manual Therapy   Manual Therapy Taping;Joint mobilization    Manual therapy comments Completed separate from all other interventions    Joint Mobilization patellar mobilizations left knee with emphasis on medial and inferior glides 30x5 sec hold.  Long axis distraction LLE grade 2-3.    Kinesiotex Facilitate Muscle      Kinesiotix   Facilitate Muscle  left knee VMO and medial patellar tracking                      PT Short Term Goals - 09/10/20 1235       PT SHORT TERM GOAL #1   Title PT to be I in HEP to allow pain in LT knee to be no greater than a 5/10 throughout the day to improve pt activity tolerance.    Time 2    Period Weeks    Status New    Target Date 09/24/20      PT SHORT TERM GOAL #2   Title PT to be able to stand/walk for an hour at a time without increased knee pain to assist in returning to work    Time 2    Period Weeks    Status New               PT Long Term Goals - 09/10/20 1237       PT LONG TERM GOAL #1   Title PT to be I in advance HEP to allow left knee pain to decrease to no greater than a 3/10 to improve functional ability.    Time 4    Period Weeks    Status New    Target Date 10/08/20      PT LONG TERM  GOAL #2   Title PT LE strength to be 5/5 to be able to go up and down 5 steps in a reciprocal manner    Time 4    Period Weeks    Status New      PT LONG TERM GOAL #3   Title PT have returned to full days at work without noted increased knee pain.    Time 4    Period Weeks    Status New      PT LONG TERM GOAL #4   Title PT to be able to complete 14 sit to stand in 30 seconds to demonstrate improved function of left knee.    Time 4    Period Weeks    Status New                   Plan - 09/24/20 0900     Clinical Impression Statement Tolerating tx sessions well with addition of backwards walking. Continues to lack left knee terminal extension and increased discomfort with end-range flexion from patellar compression. Continued sessions indicated to progress PRE and facilitate medial patellar tracking    Examination-Activity Limitations Squat;Dressing;Lift;Locomotion Level;Stairs    Examination-Participation Restrictions Cleaning;Community Activity;Laundry;Occupation;Shop    Stability/Clinical Decision Making Stable/Uncomplicated    Rehab Potential Good    PT Frequency 2x / week    PT Duration 8 weeks    PT Treatment/Interventions Patient/family education;Therapeutic activities;Therapeutic exercise;Manual techniques    PT Next Visit Plan AROM, Progress quad strengthening and hamstring lengthening.  Manual as needed    Consulted and Agree with Plan of Care Patient             Patient will benefit from skilled therapeutic intervention in order to improve the following deficits and impairments:  Decreased activity tolerance, Decreased balance, Decreased strength, Difficulty walking, Pain  Visit Diagnosis: Chronic pain of left knee     Problem List Patient Active Problem List   Diagnosis Date Noted   ALLERGIC RHINITIS  06/03/2008   CELLULITIS, [place], RIGHT 06/03/2008   OBESITY 07/20/2007   HYPERLIPIDEMIA 01/16/2007   CARPAL TUNNEL SYNDROME 01/16/2007   NECK PAIN  01/16/2007   BACK PAIN 01/16/2007   NEPHROLITHIASIS, HX OF 01/16/2007    Toniann Fail 09/24/2020, 9:02 AM  [place], [place], [address] Phone: [phone]   Fax:  [phone]  Name: Lauren Hansen MRN: [id number] Date of Birth: Jan 18, 1956

## 2020-09-26 ENCOUNTER — Ambulatory Visit (HOSPITAL_COMMUNITY): Payer: 59 | Admitting: Physical Therapy

## 2020-09-26 ENCOUNTER — Other Ambulatory Visit: Payer: Self-pay

## 2020-09-26 DIAGNOSIS — G8929 Other chronic pain: Secondary | ICD-10-CM

## 2020-09-26 DIAGNOSIS — M25562 Pain in left knee: Secondary | ICD-10-CM | POA: Diagnosis not present

## 2020-09-26 NOTE — Therapy (Signed)
[facility] [address] [place], [place], [address] Phone: [phone]   Fax:  [phone]  Physical Therapy Treatment  Patient Details  Name: Lauren Hansen MRN: [id number] Date of Birth: [birth year] Referring Provider (PT): Carlyle Lipa   Encounter Date: 09/26/2020   PT End of Session - 09/26/20 0941     Visit Number 4    Number of Visits 8    Date for PT Re-Evaluation 10/10/20    Authorization Type [facility] [facility]    Progress Note Due on Visit 8    PT Start Time 0839    PT Stop Time 0922    PT Time Calculation (min) 43 min             Past Medical History:  Diagnosis Date   Atypical nevus 07/24/2001   Right Cheek-Slight to Moderate   Atypical nevus 06/30/2017   Left Scapula-Moderate   Cervical radiculopathy    Chronic neck and back pain    Dysplastic nevus 07/24/2001   Right Abdomen   Lumbar radiculopathy    Plantar fasciitis     Past Surgical History:  Procedure Laterality Date   ABDOMINAL HYSTERECTOMY     APPENDECTOMY     COLONOSCOPY N/A 05/11/2012   Procedure: COLONOSCOPY;  Surgeon: Rogene Houston, MD;  [facility];  Service: Endoscopy;  Laterality: N/A;  1030   EXTRACORPOREAL SHOCK WAVE LITHOTRIPSY Left 06/28/2019   Procedure: EXTRACORPOREAL SHOCK WAVE LITHOTRIPSY (ESWL);  Surgeon: Alexis Frock, MD;  Location: [facility];  Service: Urology;  Laterality: Left;   right hand surg       There were no vitals filed for this visit.   Subjective Assessment - 09/26/20 0842     Subjective Pt states her knee is getting better with current pain level at 1/10.    Currently in Pain? Yes    Pain Score 1     Pain Location Knee    Pain Orientation Left;Medial;Lateral    Pain Descriptors / Indicators Aching                               [facility] Adult PT Treatment/Exercise - 09/26/20 0936       Knee/Hip Exercises: Stretches   Active Hamstring Stretch Left;3 reps;30 seconds     Active Hamstring Stretch Limitations standing with 12in step height    Gastroc Stretch 3 reps;30 seconds    Gastroc Stretch Limitations slantboard      Knee/Hip Exercises: Standing   Heel Raises 20 reps    Forward Lunges Both;10 reps    Forward Lunges Limitations onto 4" step    Hip Abduction Both;10 reps    Hip Extension Both;10 reps    Forward Step Up Both;10 reps;Step Height: 4";Hand Hold: 1    SLS with Vectors 5X5" each with 1 HHA      Knee/Hip Exercises: Supine   Quad Sets Strengthening;Left;3 sets;10 reps    Straight Leg Raises Strengthening;Left;3 sets;10 reps      Manual Therapy   Manual Therapy Taping    Manual therapy comments Completed separate from all other interventions    Kinesiotex Facilitate Muscle      Kinesiotix   Facilitate Muscle  left knee VMO and medial patellar tracking                      PT Short Term Goals - 09/10/20 1235  PT SHORT TERM GOAL #1   Title PT to be I in HEP to allow pain in LT knee to be no greater than a 5/10 throughout the day to improve pt activity tolerance.    Time 2    Period Weeks    Status New    Target Date 09/24/20      PT SHORT TERM GOAL #2   Title PT to be able to stand/walk for an hour at a time without increased knee pain to assist in returning to work    Time 2    Period Weeks    Status New               PT Long Term Goals - 09/10/20 1237       PT LONG TERM GOAL #1   Title PT to be I in advance HEP to allow left knee pain to decrease to no greater than a 3/10 to improve functional ability.    Time 4    Period Weeks    Status New    Target Date 10/08/20      PT LONG TERM GOAL #2   Title PT LE strength to be 5/5 to be able to go up and down 5 steps in a reciprocal manner    Time 4    Period Weeks    Status New      PT LONG TERM GOAL #3   Title PT have returned to full days at work without noted increased knee pain.    Time 4    Period Weeks    Status New      PT LONG TERM  GOAL #4   Title PT to be able to complete 14 sit to stand in 30 seconds to demonstrate improved function of left knee.    Time 4    Period Weeks    Status New                   Plan - 09/26/20 [id number]     Clinical Impression Statement Pt overall improving with decreasing pain and increasing function.  Progressed LE stability challenges this session with good control and no pain voiced.  Pt did report pain with lateral step up using 4" riser so discontinued this activity.  Vectors most challenging for patient due to gluteal weakness.  Reapplication of kinesiotape as indicated to facilitate medial patellar tracking and improve pain.    Examination-Activity Limitations Squat;Dressing;Lift;Locomotion Level;Stairs    Examination-Participation Restrictions Cleaning;Community Activity;Laundry;Occupation;Shop    Stability/Clinical Decision Making Stable/Uncomplicated    Rehab Potential Good    PT Frequency 2x / week    PT Duration 8 weeks    PT Treatment/Interventions Patient/family education;Therapeutic activities;Therapeutic exercise;Manual techniques    PT Next Visit Plan AROM, Progress quad strengthening and hamstring lengthening.  Manual as needed/reapplicaiton of Ktape as needed.    Consulted and Agree with Plan of Care Patient             Patient will benefit from skilled therapeutic intervention in order to improve the following deficits and impairments:  Decreased activity tolerance, Decreased balance, Decreased strength, Difficulty walking, Pain  Visit Diagnosis: Chronic pain of left knee     Problem List Patient Active Problem List   Diagnosis Date Noted   ALLERGIC RHINITIS 06/03/2008   CELLULITIS, THIGH, RIGHT 06/03/2008   OBESITY 07/20/2007   HYPERLIPIDEMIA 01/16/2007   CARPAL TUNNEL SYNDROME 01/16/2007   NECK PAIN 01/16/2007   BACK PAIN 01/16/2007  NEPHROLITHIASIS, HX OF 01/16/2007   Teena Irani, PTA/CLT [phone]  Teena Irani 09/26/2020, 9:42  AM  [facility] [place], [place], [address] Phone: [phone]   Fax:  [phone]  Name: TALA SAWYERS MRN: [id number] Date of Birth: Jul 15, 1955

## 2020-09-30 ENCOUNTER — Ambulatory Visit (HOSPITAL_COMMUNITY): Payer: 59 | Admitting: Physical Therapy

## 2020-09-30 ENCOUNTER — Other Ambulatory Visit: Payer: Self-pay

## 2020-09-30 DIAGNOSIS — M25562 Pain in left knee: Secondary | ICD-10-CM

## 2020-09-30 DIAGNOSIS — G8929 Other chronic pain: Secondary | ICD-10-CM | POA: Diagnosis not present

## 2020-09-30 NOTE — Therapy (Signed)
[facility] [address] [place], [place], [address] Phone: [phone]   Fax:  [phone]  Physical Therapy Treatment  Patient Details  Name: Lauren Hansen MRN: [id number] Date of Birth: June 07, 1955 Referring Provider (PT): Carlyle Lipa   Encounter Date: 09/30/2020   PT End of Session - 09/30/20 0920     Visit Number 5    Number of Visits 8    Date for PT Re-Evaluation 10/10/20    Authorization Type [facility]    Progress Note Due on Visit 8    PT Start Time 0838    PT Stop Time 0918    PT Time Calculation (min) 40 min    Activity Tolerance Patient tolerated treatment well    Behavior During Therapy [facility] for tasks assessed/performed             Past Medical History:  Diagnosis Date   Atypical nevus 07/24/2001   Right Cheek-Slight to Moderate   Atypical nevus 06/30/2017   Left Scapula-Moderate   Cervical radiculopathy    Chronic neck and back pain    Dysplastic nevus 07/24/2001   Right Abdomen   Lumbar radiculopathy    Plantar fasciitis     Past Surgical History:  Procedure Laterality Date   ABDOMINAL HYSTERECTOMY     APPENDECTOMY     COLONOSCOPY N/A 05/11/2012   Procedure: COLONOSCOPY;  Surgeon: Rogene Houston, MD;  [facility];  Service: Endoscopy;  Laterality: N/A;  1030   EXTRACORPOREAL SHOCK WAVE LITHOTRIPSY Left 06/28/2019   Procedure: EXTRACORPOREAL SHOCK WAVE LITHOTRIPSY (ESWL);  Surgeon: Alexis Frock, MD;  Location: [facility];  Service: Urology;  Laterality: Left;   right hand surg       There were no vitals filed for this visit.   Subjective Assessment - 09/30/20 0841     Subjective Pt is doing better, has no questions on her HEP    How long can you sit comfortably? 15 minutes and then she wants to move her knee.    How long can you stand comfortably? 30 minutes    How long can you walk comfortably? 30 minutes    Diagnostic tests MRI    Patient Stated Goals less pain;  bend down    Currently in Pain? Yes    Pain Score 1     Pain Location Knee    Pain Orientation Left;Medial    Pain Descriptors / Indicators Aching    Pain Type Chronic pain    Pain Onset More than a month ago    Pain Frequency Constant    Aggravating Factors  WB    Pain Relieving Factors rest    Effect of Pain on Daily Activities limits                               [facility] Adult PT Treatment/Exercise - 09/30/20 0001       Exercises   Exercises Knee/Hip      Knee/Hip Exercises: Stretches   Passive Hamstring Stretch Left;3 reps;30 seconds    Passive Hamstring Stretch Limitations on 12" b0x    Gastroc Stretch 3 reps;30 seconds    Gastroc Stretch Limitations slantboard      Knee/Hip Exercises: Standing   Heel Raises 15 reps    Knee Flexion Left;Strengthening;10 reps;Limitations    Knee Flexion Limitations 3#    Terminal Knee Extension Left;15 reps    Terminal Knee  Extension Limitations pink ball against the wall    Hip Abduction Left;15 reps    Abduction Limitations 3#    Hip Extension Left;10 reps    Extension Limitations 3#    Lateral Step Up Both;Step Height: 4";5 reps    Forward Step Up Step Height: 4";Left;15 reps;Hand Hold: 2    Rocker Board 2 minutes    Other Standing Knee Exercises side step 2 rt      Knee/Hip Exercises: Supine   Quad Sets Strengthening;Left;3 sets;10 reps    Straight Leg Raises Strengthening;Left;3 sets;10 reps      Manual Therapy   Manual Therapy Taping    Manual therapy comments Completed separate from all other interventions    Kinesiotex Facilitate Muscle      Kinesiotix   Facilitate Muscle  left knee VMO and medial patellar tracking                      PT Short Term Goals - 09/10/20 1235       PT SHORT TERM GOAL #1   Title PT to be I in HEP to allow pain in LT knee to be no greater than a 5/10 throughout the day to improve pt activity tolerance.    Time 2    Period Weeks    Status New    Target  Date 09/24/20      PT SHORT TERM GOAL #2   Title PT to be able to stand/walk for an hour at a time without increased knee pain to assist in returning to work    Time 2    Period Weeks    Status New               PT Long Term Goals - 09/10/20 1237       PT LONG TERM GOAL #1   Title PT to be I in advance HEP to allow left knee pain to decrease to no greater than a 3/10 to improve functional ability.    Time 4    Period Weeks    Status New    Target Date 10/08/20      PT LONG TERM GOAL #2   Title PT LE strength to be 5/5 to be able to go up and down 5 steps in a reciprocal manner    Time 4    Period Weeks    Status New      PT LONG TERM GOAL #3   Title PT have returned to full days at work without noted increased knee pain.    Time 4    Period Weeks    Status New      PT LONG TERM GOAL #4   Title PT to be able to complete 14 sit to stand in 30 seconds to demonstrate improved function of left knee.    Time 4    Period Weeks    Status New                   Plan - 09/30/20 [id number]     Clinical Impression Statement Added lateral stepping with tband, and wts as well as rocker board with overall good tolerance.  Pt needs cuing to stay upright to make sure weight is not going forward on her knee as she tends to do exercises while looking at herknee.  Applied kinesiotap at end of session along with patellar mobs    Examination-Activity Limitations Squat;Dressing;Lift;Locomotion Level;Stairs    Examination-Participation Restrictions Cleaning;Community  Activity;Laundry;Occupation;Shop    Stability/Clinical Decision Making Stable/Uncomplicated    Rehab Potential Good    PT Frequency 2x / week    PT Duration 8 weeks    PT Treatment/Interventions Patient/family education;Therapeutic activities;Therapeutic exercise;Manual techniques    PT Next Visit Plan AROM, Progress quad strengthening and hamstring lengthening.  Manual as needed/reapplicaiton of Ktape as needed.    PT  Home Exercise Plan side stepping with t band as well as terminal extension    Consulted and Agree with Plan of Care Patient             Patient will benefit from skilled therapeutic intervention in order to improve the following deficits and impairments:  Decreased activity tolerance, Decreased balance, Decreased strength, Difficulty walking, Pain  Visit Diagnosis: Chronic pain of left knee     Problem List Patient Active Problem List   Diagnosis Date Noted   ALLERGIC RHINITIS 06/03/2008   CELLULITIS, [place], RIGHT 06/03/2008   OBESITY 07/20/2007   HYPERLIPIDEMIA 01/16/2007   CARPAL TUNNEL SYNDROME 01/16/2007   NECK PAIN 01/16/2007   BACK PAIN 01/16/2007   NEPHROLITHIASIS, HX OF 01/16/2007   Rayetta Humphrey, PT CLT [phone]  09/30/2020, 9:23 AM  [facility] [place], [place], [address] Phone: [phone]   Fax:  [phone]  Name: Lauren Hansen MRN: [id number] Date of Birth: Mar 31, 1955

## 2020-10-02 ENCOUNTER — Other Ambulatory Visit (HOSPITAL_COMMUNITY): Payer: Self-pay

## 2020-10-03 ENCOUNTER — Other Ambulatory Visit (HOSPITAL_COMMUNITY): Payer: Self-pay

## 2020-10-03 MED ORDER — METHOCARBAMOL 500 MG PO TABS
500.0000 mg | ORAL_TABLET | Freq: Three times a day (TID) | ORAL | 2 refills | Status: DC
  Filled 2020-10-03: qty 60, 20d supply, fill #0
  Filled 2020-12-18: qty 60, 20d supply, fill #1

## 2020-10-08 ENCOUNTER — Ambulatory Visit (HOSPITAL_COMMUNITY): Payer: 59 | Admitting: Physical Therapy

## 2020-10-08 ENCOUNTER — Other Ambulatory Visit: Payer: Self-pay

## 2020-10-08 ENCOUNTER — Encounter (HOSPITAL_COMMUNITY): Payer: Self-pay | Admitting: Physical Therapy

## 2020-10-08 DIAGNOSIS — M25562 Pain in left knee: Secondary | ICD-10-CM | POA: Diagnosis not present

## 2020-10-08 DIAGNOSIS — G8929 Other chronic pain: Secondary | ICD-10-CM

## 2020-10-08 NOTE — Therapy (Signed)
[facility] [address] [place], [place], [address] Phone: [phone]   Fax:  [phone]  Physical Therapy Treatment  Patient Details  Name: Lauren Hansen MRN: [id number] Date of Birth: 1955-07-01 Referring Provider (PT): Carlyle Lipa   Encounter Date: 10/08/2020   PT End of Session - 10/08/20 0907     Visit Number 6    Number of Visits 8    Date for PT Re-Evaluation 10/10/20    Authorization Type [facility]    Progress Note Due on Visit 8    PT Start Time 0836    PT Stop Time 0916    PT Time Calculation (min) 40 min    Activity Tolerance Patient tolerated treatment well    Behavior During Therapy [facility] for tasks assessed/performed             Past Medical History:  Diagnosis Date   Atypical nevus 07/24/2001   Right Cheek-Slight to Moderate   Atypical nevus 06/30/2017   Left Scapula-Moderate   Cervical radiculopathy    Chronic neck and back pain    Dysplastic nevus 07/24/2001   Right Abdomen   Lumbar radiculopathy    Plantar fasciitis     Past Surgical History:  Procedure Laterality Date   ABDOMINAL HYSTERECTOMY     APPENDECTOMY     COLONOSCOPY N/A 05/11/2012   Procedure: COLONOSCOPY;  Surgeon: Rogene Houston, MD;  [facility];  Service: Endoscopy;  Laterality: N/A;  1030   EXTRACORPOREAL SHOCK WAVE LITHOTRIPSY Left 06/28/2019   Procedure: EXTRACORPOREAL SHOCK WAVE LITHOTRIPSY (ESWL);  Surgeon: Alexis Frock, MD;  Location[facility];  Service: Urology;  Laterality: Left;   right hand surg       There were no vitals filed for this visit.   Subjective Assessment - 10/08/20 0837     Subjective Pt states that she always has some pain but it is better.    How long can you sit comfortably? 15 minutes and then she wants to move her knee.    How long can you stand comfortably? 30 minutes    How long can you walk comfortably? 30 minutes    Diagnostic tests MRI    Patient Stated Goals  less pain; bend down    Currently in Pain? Yes    Pain Score 1     Pain Location Knee    Pain Orientation Medial;Left    Pain Descriptors / Indicators Aching    Pain Type Chronic pain    Pain Onset More than a month ago    Pain Frequency Constant    Aggravating Factors  squatting    Pain Relieving Factors rest    Effect of Pain on Daily Activities limits                               [facility] Adult PT Treatment/Exercise - 10/08/20 0001       Exercises   Exercises Knee/Hip      Knee/Hip Exercises: Stretches   Passive Hamstring Stretch Left;30 seconds;20 seconds;2 reps    Passive Hamstring Stretch Limitations on 12" b0x    Quad Stretch Left;30 seconds;2 reps;20 seconds    Gastroc Stretch 3 reps;30 seconds    Gastroc Stretch Limitations slantboard      Knee/Hip Exercises: Standing   Heel Raises Both;20 reps    Heel Raises Limitations toe raises x 10    Knee Flexion  Left;Strengthening;10 reps;Limitations;15 reps    Knee Flexion Limitations 3#    Forward Lunges Both;15 reps    Forward Lunges Limitations onto 6" step    Terminal Knee Extension Left;15 reps    Terminal Knee Extension Limitations pink ball against the wall    Forward Step Up 15 reps;Hand Hold: 2;Step Height: 4";Both    Rocker Board 2 minutes    SLS with Vectors 30" x 3    Other Standing Knee Exercises side step 2 rt                      PT Short Term Goals - 09/10/20 1235       PT SHORT TERM GOAL #1   Title PT to be I in HEP to allow pain in LT knee to be no greater than a 5/10 throughout the day to improve pt activity tolerance.    Time 2    Period Weeks    Status New    Target Date 09/24/20      PT SHORT TERM GOAL #2   Title PT to be able to stand/walk for an hour at a time without increased knee pain to assist in returning to work    Time 2    Period Weeks    Status New               PT Long Term Goals - 09/10/20 1237       PT LONG TERM GOAL #1   Title PT  to be I in advance HEP to allow left knee pain to decrease to no greater than a 3/10 to improve functional ability.    Time 4    Period Weeks    Status New    Target Date 10/08/20      PT LONG TERM GOAL #2   Title PT LE strength to be 5/5 to be able to go up and down 5 steps in a reciprocal manner    Time 4    Period Weeks    Status New      PT LONG TERM GOAL #3   Title PT have returned to full days at work without noted increased knee pain.    Time 4    Period Weeks    Status New      PT LONG TERM GOAL #4   Title PT to be able to complete 14 sit to stand in 30 seconds to demonstrate improved function of left knee.    Time 4    Period Weeks    Status New                   Plan - 10/08/20 0908     Clinical Impression Statement Added single leg stance to program, trial of no taping.  Pt  has improved technique with exercises needing decreased verbal cuing for correction.    Examination-Activity Limitations Squat;Dressing;Lift;Locomotion Level;Stairs    Examination-Participation Restrictions Cleaning;Community Activity;Laundry;Occupation;Shop    Stability/Clinical Decision Making Stable/Uncomplicated    Rehab Potential Good    PT Frequency 2x / week    PT Duration 8 weeks    PT Treatment/Interventions Patient/family education;Therapeutic activities;Therapeutic exercise;Manual techniques    PT Next Visit Plan Re assess    PT Home Exercise Plan side stepping with t band as well as terminal extension    Consulted and Agree with Plan of Care Patient             Patient will benefit  from skilled therapeutic intervention in order to improve the following deficits and impairments:  Decreased activity tolerance, Decreased balance, Decreased strength, Difficulty walking, Pain  Visit Diagnosis: Chronic pain of left knee     Problem List Patient Active Problem List   Diagnosis Date Noted   ALLERGIC RHINITIS 06/03/2008   CELLULITIS, [place], RIGHT 06/03/2008    OBESITY 07/20/2007   HYPERLIPIDEMIA 01/16/2007   CARPAL TUNNEL SYNDROME 01/16/2007   NECK PAIN 01/16/2007   BACK PAIN 01/16/2007   NEPHROLITHIASIS, HX OF 01/16/2007   Rayetta Humphrey, PT CLT [phone]  10/08/2020, 9:21 AM  [facility] [place], [place], [address] Phone: [phone]   Fax:  [phone]  Name: OAK[place] COURTEMANCHE MRN: [id number] Date of Birth: 22-Dec-1955

## 2020-10-09 ENCOUNTER — Ambulatory Visit (HOSPITAL_COMMUNITY): Payer: 59 | Admitting: Physical Therapy

## 2020-10-09 DIAGNOSIS — G8929 Other chronic pain: Secondary | ICD-10-CM | POA: Diagnosis not present

## 2020-10-09 DIAGNOSIS — M25562 Pain in left knee: Secondary | ICD-10-CM | POA: Diagnosis not present

## 2020-10-09 NOTE — Therapy (Signed)
[facility] [address] [place], [place], [address] Phone: [phone]   Fax:  [phone]  Physical Therapy Treatment  Patient Details  Name: Lauren Hansen MRN: [id number] Date of Birth: [birth year] Referring Provider (PT): Carlyle Lipa   Encounter Date: 10/09/2020   PT End of Session - 10/09/20 0928     Visit Number 7    Number of Visits 8    Date for PT Re-Evaluation 10/10/20    Authorization Type [facility] [facility]    Progress Note Due on Visit 8    PT Start Time 0836    PT Stop Time 0916    PT Time Calculation (min) 40 min    Activity Tolerance Patient tolerated treatment well    Behavior During Therapy [facility] for tasks assessed/performed             Past Medical History:  Diagnosis Date   Atypical nevus 07/24/2001   Right Cheek-Slight to Moderate   Atypical nevus 06/30/2017   Left Scapula-Moderate   Cervical radiculopathy    Chronic neck and back pain    Dysplastic nevus 07/24/2001   Right Abdomen   Lumbar radiculopathy    Plantar fasciitis     Past Surgical History:  Procedure Laterality Date   ABDOMINAL HYSTERECTOMY     APPENDECTOMY     COLONOSCOPY N/A 05/11/2012   Procedure: COLONOSCOPY;  Surgeon: Rogene Houston, MD;  [facility];  Service: Endoscopy;  Laterality: N/A;  1030   EXTRACORPOREAL SHOCK WAVE LITHOTRIPSY Left 06/28/2019   Procedure: EXTRACORPOREAL SHOCK WAVE LITHOTRIPSY (ESWL);  Surgeon: Alexis Frock, MD;  Location: [facility];  Service: Urology;  Laterality: Left;   right hand surg       There were no vitals filed for this visit.   Subjective Assessment - 10/09/20 0845     Subjective Pt states that she feels that she is about 60% better.    How long can you sit comfortably? Feels that she can sit for  30 minutes now was 15 minutes and then she wants to move her knee.    How long can you stand comfortably? 15 minutes was 30 minutes    How long can you walk comfortably? 30  minutes was 30    Diagnostic tests MRI    Patient Stated Goals less pain; bend down    Currently in Pain? Yes    Pain Score 1     Pain Location Abdomen    Pain Orientation Lateral;Left    Pain Descriptors / Indicators Aching;Throbbing    Pain Type Chronic pain    Pain Onset More than a month ago    Pain Frequency Constant    Aggravating Factors  squatting    Pain Relieving Factors exercise                [facility] PT Assessment - 10/09/20 0001       Assessment   Medical Diagnosis Right knee pain    Referring Provider (PT) Carlyle Lipa    Onset Date/Surgical Date 07/16/20   acute exacerbation   Next MD Visit 10/21/2020    Prior Therapy none      Precautions   Precautions None      Restrictions   Weight Bearing Restrictions No      Home Environment   Living Environment Private residence    Home Access Stairs to enter    Entrance Stairs-Number of Steps [place] One  level      Prior Function   Level of Independence Independent    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      Cognition   Overall Cognitive Status Within Functional Limits for tasks assessed      Observation/Other Assessments   Focus on Therapeutic Outcomes (FOTO)  65 was 59;      Functional Tests   Functional tests Single leg stance;Sit to Stand      Single Leg Stance   Comments RT:  60   ; LT  49 was 40"      Sit to Stand   Comments 8 in 30 seconds; was 8 in 30 seconds      AROM   Left Knee Extension 5   was 8   Left Knee Flexion 123   was 108     Strength   Right Hip Flexion 5/5    Right Hip Extension 5/5    Right Hip ABduction 5/5    Left Hip Flexion 5/5    Left Hip Extension 5/5   was 4/5   Right Knee Extension 5/5    Left Knee Extension 4+/5   was 4+     Flexibility             Ambulation/Gait   Ambulation Distance (Feet)    Gait Comments                                   PT Education - 10/09/20 0927      Education Details Patellar mobs, self manual to decrease edema, to slowly lower squats    Person(s) Educated Patient    Methods Explanation    Comprehension Verbalized understanding              PT Short Term Goals - 10/09/20 0911       PT SHORT TERM GOAL #1   Title PT to be I in HEP to allow pain in LT knee to be no greater than a 5/10 throughout the day to improve pt activity tolerance.    Time 2    Period Weeks    Status Achieved    Target Date 09/24/20      PT SHORT TERM GOAL #2   Title PT to be able to stand/walk for an hour at a time without increased knee pain to assist in returning to work    Time 2    Period Weeks    Status Achieved               PT Long Term Goals - 10/09/20 0911       PT LONG TERM GOAL #1   Title PT to be I in advance HEP to allow left knee pain to decrease to no greater than a 3/10 to improve functional ability.    Time 4    Period Weeks    Status Partially Met      PT LONG TERM GOAL #2   Title PT LE strength to be 5/5 to be able to go up and down 5 steps in a reciprocal manner    Time 4    Period Weeks    Status Partially Met      PT LONG TERM GOAL #3   Title PT have returned to full days at work without noted increased knee pain.    Time 4  Period Weeks    Status Not Met      PT LONG TERM GOAL #4   Title PT to be able to complete 14 sit to stand in 30 seconds to demonstrate improved function of left knee.    Time 4    Period Weeks    Status Not Met                   Plan - 10/09/20 0929     Clinical Impression Statement Pt reassessed.  She has met all of her short term goals, Long term goals have not been met.  Therapist feels this is secondary to increased edema and decreased patella mobs.  PT educated in self manual to decrease edema as well as patella mobs.  PT is to work on these until next visit next week to see if this assists in decreasing pt pain.    Examination-Activity Limitations  Squat;Dressing;Lift;Locomotion Level;Stairs    Examination-Participation Restrictions Cleaning;Community Activity;Laundry;Occupation;Shop    Stability/Clinical Decision Making Stable/Uncomplicated    Rehab Potential Good    PT Frequency 2x / week    PT Duration 8 weeks    PT Treatment/Interventions Patient/family education;Therapeutic activities;Therapeutic exercise;Manual techniques    PT Next Visit Plan assess 2 minute walk, and steps, answer any questions on manual or patella mobs.    PT Home Exercise Plan eval: quad set, heel slide, hamstring stretch; 8/24: side stepping with t band as well as standing  terminal extension    Consulted and Agree with Plan of Care Patient             Patient will benefit from skilled therapeutic intervention in order to improve the following deficits and impairments:  Decreased activity tolerance, Decreased balance, Decreased strength, Difficulty walking, Pain  Visit Diagnosis: Chronic pain of left knee     Problem List Patient Active Problem List   Diagnosis Date Noted   ALLERGIC RHINITIS 06/03/2008   CELLULITIS, [place], RIGHT 06/03/2008   OBESITY 07/20/2007   HYPERLIPIDEMIA 01/16/2007   CARPAL TUNNEL SYNDROME 01/16/2007   NECK PAIN 01/16/2007   BACK PAIN 01/16/2007   NEPHROLITHIASIS, HX OF 01/16/2007   Rayetta Humphrey, PT CLT [phone]  10/09/2020, 9:36 AM  [facility] [place], [place], [address] Phone: [phone]   Fax:  [phone]  Name: Lauren Hansen MRN: [id number] Date of Birth: 1955-04-29

## 2020-10-13 ENCOUNTER — Other Ambulatory Visit: Payer: Self-pay

## 2020-10-13 ENCOUNTER — Encounter (HOSPITAL_COMMUNITY): Payer: Self-pay | Admitting: Physical Therapy

## 2020-10-13 ENCOUNTER — Ambulatory Visit (HOSPITAL_COMMUNITY): Payer: 59 | Admitting: Physical Therapy

## 2020-10-13 DIAGNOSIS — G8929 Other chronic pain: Secondary | ICD-10-CM

## 2020-10-13 DIAGNOSIS — M25562 Pain in left knee: Secondary | ICD-10-CM | POA: Diagnosis not present

## 2020-10-13 NOTE — Patient Instructions (Signed)
Access Code: [email or web address] URL: [email or web address] Date: 10/13/2020 Prepared by: Mitzi Hansen Laquinda Moller  Exercises Seated Inferior Patellar Glide - 1 x daily - 7 x weekly - 10 reps

## 2020-10-13 NOTE — Therapy (Signed)
[facility] [address] [place], [place], [address] Phone: [phone]   Fax:  [phone]  Physical Therapy Treatment/Discharge Summary  Patient Details  Name: Lauren Hansen MRN: [id number] Date of Birth: 26-Oct-1955 Referring Provider (PT): Carlyle Lipa   Encounter Date: 10/13/2020  PHYSICAL THERAPY DISCHARGE SUMMARY  Visits from Start of Care: 8  Current functional level related to goals / functional outcomes: See below   Remaining deficits: See below   Education / Equipment: See below   Patient agrees to discharge. Patient goals were partially met. Patient is being discharged due to meeting the stated rehab goals.    PT End of Session - 10/13/20 0749     Visit Number 8    Number of Visits 8    Date for PT Re-Evaluation 10/10/20    Authorization Type [facility] [facility]    Progress Note Due on Visit 8    PT Start Time 0750    PT Stop Time 0810    PT Time Calculation (min) 20 min    Activity Tolerance Patient tolerated treatment well    Behavior During Therapy [facility] for tasks assessed/performed             Past Medical History:  Diagnosis Date   Atypical nevus 07/24/2001   Right Cheek-Slight to Moderate   Atypical nevus 06/30/2017   Left Scapula-Moderate   Cervical radiculopathy    Chronic neck and back pain    Dysplastic nevus 07/24/2001   Right Abdomen   Lumbar radiculopathy    Plantar fasciitis     Past Surgical History:  Procedure Laterality Date   ABDOMINAL HYSTERECTOMY     APPENDECTOMY     COLONOSCOPY N/A 05/11/2012   Procedure: COLONOSCOPY;  Surgeon: Rogene Houston, MD;  [facility];  Service: Endoscopy;  Laterality: N/A;  1030   EXTRACORPOREAL SHOCK WAVE LITHOTRIPSY Left 06/28/2019   Procedure: EXTRACORPOREAL SHOCK WAVE LITHOTRIPSY (ESWL);  Surgeon: Alexis Frock, MD;  Location: [facility];  Service: Urology;  Laterality: Left;   right hand surg       There were no vitals  filed for this visit.   Subjective Assessment - 10/13/20 0750     Subjective Patient states she didn't do the knee cap ones much. She has been doing massage. She continues to have knee pain at work 4-5/10.    How long can you sit comfortably? Feels that she can sit for  30 minutes now was 15 minutes and then she wants to move her knee.    How long can you stand comfortably? 15 minutes was 30 minutes    How long can you walk comfortably? 30 minutes was 30    Diagnostic tests MRI    Patient Stated Goals less pain; bend down    Currently in Pain? Yes    Pain Location Knee    Pain Orientation Left    Pain Descriptors / Indicators Aching    Pain Type Chronic pain    Pain Onset More than a month ago    Pain Frequency Constant                [facility] PT Assessment - 10/13/20 0001       Assessment   Medical Diagnosis Right knee pain    Referring Provider (PT) Carlyle Lipa    Onset Date/Surgical Date 07/16/20   acute exacerbation   Next MD Visit 10/21/2020    Prior Therapy none  Precautions   Precautions None      Restrictions   Weight Bearing Restrictions No      Home Environment   Living Environment Private residence    Home Access Stairs to enter    Entrance Stairs-Number of Steps 1    [place] One level      Prior Function   Level of Independence Independent    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   Overall Cognitive Status Within Functional Limits for tasks assessed      Observation/Other Assessments   Focus on Therapeutic Outcomes (FOTO)  65 was 59;      Functional Tests   Functional tests Single leg stance;Sit to Stand      Single Leg Stance   Comments RT:  60   ; LT  49 was 40"      Sit to Stand   Comments 8 in 30 seconds; was 8 in 30 seconds      AROM   Left Knee Extension 5   was 8   Left Knee Flexion 123   was 108     Strength   Right Hip Flexion 5/5    Right Hip Extension 5/5    Right Hip  ABduction 5/5    Left Hip Flexion 5/5    Left Hip Extension 5/5   was 4/5   Right Knee Extension 5/5    Left Knee Extension 4+/5   was 4+     Flexibility   Soft Tissue Assessment /Muscle Length yes    Hamstrings Lt 140; Rt 155      Ambulation/Gait   Ambulation Distance (Feet) 440 Feet    Stairs Yes    Gait Comments 2 minute; Stairs: able to ambulate with alternating pattern not pain but "tightness"                           [facility] Adult PT Treatment/Exercise - 10/13/20 0001       Manual Therapy   Manual Therapy Joint mobilization    Manual therapy comments Completed separate from all other interventions    Joint Mobilization patellar mobs                    PT Education - 10/13/20 0749     Education Details HEP, patellar mobs, returning to PT if needed    Person(s) Educated Patient    Methods Explanation;Demonstration    Comprehension Verbalized understanding;Returned demonstration              PT Short Term Goals - 10/09/20 0911       PT SHORT TERM GOAL #1   Title PT to be I in HEP to allow pain in LT knee to be no greater than a 5/10 throughout the day to improve pt activity tolerance.    Time 2    Period Weeks    Status Achieved    Target Date 09/24/20      PT SHORT TERM GOAL #2   Title PT to be able to stand/walk for an hour at a time without increased knee pain to assist in returning to work    Time 2    Period Weeks    Status Achieved               PT Long Term Goals - 10/13/20 7253       PT  LONG TERM GOAL #1   Title PT to be I in advance HEP to allow left knee pain to decrease to no greater than a 3/10 to improve functional ability.    Time 4    Period Weeks    Status Partially Met      PT LONG TERM GOAL #2   Title PT LE strength to be 5/5 to be able to go up and down 5 steps in a reciprocal manner    Time 4    Period Weeks    Status Achieved      PT LONG TERM GOAL #3   Title PT have returned to full days  at work without noted increased knee pain.    Time 4    Period Weeks    Status Not Met      PT LONG TERM GOAL #4   Title PT to be able to complete 14 sit to stand in 30 seconds to demonstrate improved function of left knee.    Time 4    Period Weeks    Status Not Met                   Plan - 10/13/20 0749     Clinical Impression Statement Patient has met 2/2 short term goals and 1/4 long term goals with another goal being partially met. Patient has met goals with ability to complete HEP and improved symptoms, strength, gait, and functional mobility. Patient continues to remain limited by symptoms, edema, and strength, although they have improved since beginning therapy. Reviewed manual therapy interventions with patient and patient able to complete with good mechanics following demonstration and performance. Patient educated on returning to PT if needed. Patient discharged from skilled physical therapy at this time.    Examination-Activity Limitations Squat;Dressing;Lift;Locomotion Level;Stairs    Examination-Participation Restrictions Cleaning;Community Activity;Laundry;Occupation;Shop    Stability/Clinical Decision Making Stable/Uncomplicated    Rehab Potential Good    PT Frequency --    PT Duration --    PT Treatment/Interventions Patient/family education;Therapeutic activities;Therapeutic exercise;Manual techniques    PT Next Visit Plan n/a    PT Home Exercise Plan eval: quad set, heel slide, hamstring stretch; 8/24: side stepping with t band as well as standing  terminal extension 8/29 patellar mobs    Consulted and Agree with Plan of Care Patient             Patient will benefit from skilled therapeutic intervention in order to improve the following deficits and impairments:  Decreased activity tolerance, Decreased balance, Decreased strength, Difficulty walking, Pain  Visit Diagnosis: Chronic pain of left knee     Problem List Patient Active Problem List    Diagnosis Date Noted   ALLERGIC RHINITIS 06/03/2008   CELLULITIS, THIGH, RIGHT 06/03/2008   OBESITY 07/20/2007   HYPERLIPIDEMIA 01/16/2007   CARPAL TUNNEL SYNDROME 01/16/2007   NECK PAIN 01/16/2007   BACK PAIN 01/16/2007   NEPHROLITHIASIS, HX OF 01/16/2007    8:17 AM, 10/13/20 Mearl Latin PT, DPT Physical Therapist at [facility] [place], [place], [address] Phone: [phone]   Fax:  [phone]  Name: Lauren Hansen MRN: [id number] Date of Birth: November 02, 1955

## 2020-10-15 ENCOUNTER — Encounter (HOSPITAL_COMMUNITY): Payer: 59 | Admitting: Physical Therapy

## 2020-10-16 DIAGNOSIS — H5213 Myopia, bilateral: Secondary | ICD-10-CM | POA: Diagnosis not present

## 2020-10-16 DIAGNOSIS — E119 Type 2 diabetes mellitus without complications: Secondary | ICD-10-CM | POA: Diagnosis not present

## 2020-10-16 DIAGNOSIS — H524 Presbyopia: Secondary | ICD-10-CM | POA: Diagnosis not present

## 2020-10-16 DIAGNOSIS — H2513 Age-related nuclear cataract, bilateral: Secondary | ICD-10-CM | POA: Diagnosis not present

## 2020-10-16 DIAGNOSIS — Z0000 Encounter for general adult medical examination without abnormal findings: Secondary | ICD-10-CM | POA: Diagnosis not present

## 2020-10-21 DIAGNOSIS — M1712 Unilateral primary osteoarthritis, left knee: Secondary | ICD-10-CM | POA: Diagnosis not present

## 2020-10-22 DIAGNOSIS — E119 Type 2 diabetes mellitus without complications: Secondary | ICD-10-CM | POA: Diagnosis not present

## 2020-10-22 DIAGNOSIS — E785 Hyperlipidemia, unspecified: Secondary | ICD-10-CM | POA: Diagnosis not present

## 2020-10-22 DIAGNOSIS — E559 Vitamin D deficiency, unspecified: Secondary | ICD-10-CM | POA: Diagnosis not present

## 2020-10-30 DIAGNOSIS — M25562 Pain in left knee: Secondary | ICD-10-CM | POA: Diagnosis not present

## 2020-10-30 DIAGNOSIS — M13862 Other specified arthritis, left knee: Secondary | ICD-10-CM | POA: Diagnosis not present

## 2020-11-06 DIAGNOSIS — M25562 Pain in left knee: Secondary | ICD-10-CM | POA: Diagnosis not present

## 2020-11-06 DIAGNOSIS — M13862 Other specified arthritis, left knee: Secondary | ICD-10-CM | POA: Diagnosis not present

## 2020-11-10 ENCOUNTER — Other Ambulatory Visit (HOSPITAL_COMMUNITY): Payer: Self-pay

## 2020-11-13 DIAGNOSIS — M25562 Pain in left knee: Secondary | ICD-10-CM | POA: Diagnosis not present

## 2020-11-13 DIAGNOSIS — M13862 Other specified arthritis, left knee: Secondary | ICD-10-CM | POA: Diagnosis not present

## 2020-11-25 ENCOUNTER — Other Ambulatory Visit (HOSPITAL_COMMUNITY): Payer: Self-pay

## 2020-11-25 MED ORDER — METFORMIN HCL 500 MG PO TABS
500.0000 mg | ORAL_TABLET | Freq: Every day | ORAL | 2 refills | Status: DC
Start: 2020-10-22 — End: 2021-10-05
  Filled 2020-11-25: qty 90, 90d supply, fill #0

## 2020-12-19 ENCOUNTER — Other Ambulatory Visit (HOSPITAL_COMMUNITY): Payer: Self-pay

## 2020-12-24 ENCOUNTER — Other Ambulatory Visit: Payer: Self-pay

## 2020-12-24 ENCOUNTER — Emergency Department (HOSPITAL_COMMUNITY): Payer: 59

## 2020-12-24 ENCOUNTER — Emergency Department (HOSPITAL_COMMUNITY)
Admission: EM | Admit: 2020-12-24 | Discharge: 2020-12-24 | Disposition: A | Discharge status: Home / Self Care | Payer: 59 | Attending: Emergency Medicine | Admitting: Emergency Medicine

## 2020-12-24 DIAGNOSIS — M5442 Lumbago with sciatica, left side: Secondary | ICD-10-CM | POA: Insufficient documentation

## 2020-12-24 DIAGNOSIS — R109 Unspecified abdominal pain: Secondary | ICD-10-CM | POA: Insufficient documentation

## 2020-12-24 DIAGNOSIS — M5441 Lumbago with sciatica, right side: Secondary | ICD-10-CM | POA: Insufficient documentation

## 2020-12-24 DIAGNOSIS — K760 Fatty (change of) liver, not elsewhere classified: Secondary | ICD-10-CM | POA: Diagnosis not present

## 2020-12-24 DIAGNOSIS — M545 Low back pain, unspecified: Secondary | ICD-10-CM | POA: Diagnosis not present

## 2020-12-24 DIAGNOSIS — M5440 Lumbago with sciatica, unspecified side: Secondary | ICD-10-CM

## 2020-12-24 DIAGNOSIS — K5730 Diverticulosis of large intestine without perforation or abscess without bleeding: Secondary | ICD-10-CM | POA: Diagnosis not present

## 2020-12-24 DIAGNOSIS — I700 Atherosclerosis of aorta: Secondary | ICD-10-CM | POA: Diagnosis not present

## 2020-12-24 DIAGNOSIS — Z90710 Acquired absence of both cervix and uterus: Secondary | ICD-10-CM | POA: Diagnosis not present

## 2020-12-24 LAB — CBC WITH DIFFERENTIAL/PLATELET
Abs Immature Granulocytes: 0.03 10*3/uL (ref 0.00–0.07)
Basophils Absolute: 0 10*3/uL (ref 0.0–0.1)
Basophils Relative: 0 %
Eosinophils Absolute: 0.1 10*3/uL (ref 0.0–0.5)
Eosinophils Relative: 1 %
HCT: 41 % (ref 36.0–46.0)
Hemoglobin: 14.1 g/dL (ref 12.0–15.0)
Immature Granulocytes: 0 %
Lymphocytes Relative: 30 %
Lymphs Abs: 2.4 10*3/uL (ref 0.7–4.0)
MCH: 30.9 pg (ref 26.0–34.0)
MCHC: 34.4 g/dL (ref 30.0–36.0)
MCV: 89.7 fL (ref 80.0–100.0)
Monocytes Absolute: 0.5 10*3/uL (ref 0.1–1.0)
Monocytes Relative: 6 %
Neutro Abs: 5 10*3/uL (ref 1.7–7.7)
Neutrophils Relative %: 63 %
Platelets: 179 10*3/uL (ref 150–400)
RBC: 4.57 MIL/uL (ref 3.87–5.11)
RDW: 12.4 % (ref 11.5–15.5)
WBC: 8 10*3/uL (ref 4.0–10.5)
nRBC: 0 % (ref 0.0–0.2)

## 2020-12-24 LAB — URINALYSIS, ROUTINE W REFLEX MICROSCOPIC
Bilirubin Urine: NEGATIVE
Glucose, UA: NEGATIVE mg/dL
Hgb urine dipstick: NEGATIVE
Ketones, ur: NEGATIVE mg/dL
Nitrite: NEGATIVE
Protein, ur: NEGATIVE mg/dL
Specific Gravity, Urine: 1.013 (ref 1.005–1.030)
pH: 5 (ref 5.0–8.0)

## 2020-12-24 LAB — COMPREHENSIVE METABOLIC PANEL
ALT: 38 U/L (ref 0–44)
AST: 23 U/L (ref 15–41)
Albumin: 4.3 g/dL (ref 3.5–5.0)
Alkaline Phosphatase: 63 U/L (ref 38–126)
Anion gap: 9 (ref 5–15)
BUN: 15 mg/dL (ref 8–23)
CO2: 25 mmol/L (ref 22–32)
Calcium: 8.9 mg/dL (ref 8.9–10.3)
Chloride: 104 mmol/L (ref 98–111)
Creatinine, Ser: 0.46 mg/dL (ref 0.44–1.00)
GFR, Estimated: >60 mL/min (ref >60)
Glucose, Bld: 108 mg/dL — ABNORMAL HIGH (ref 70–99)
Potassium: 4 mmol/L (ref 3.5–5.1)
Sodium: 138 mmol/L (ref 135–145)
Total Bilirubin: 1 mg/dL (ref 0.3–1.2)
Total Protein: 7 g/dL (ref 6.5–8.1)

## 2020-12-24 MED ORDER — ONDANSETRON HCL 4 MG/2ML IJ SOLN
4.0000 mg | Freq: Once | INTRAMUSCULAR | Status: AC
  Administered 2020-12-24: 4 mg via INTRAVENOUS
  Filled 2020-12-24: qty 2

## 2020-12-24 MED ORDER — METHYLPREDNISOLONE SODIUM SUCC 125 MG IJ SOLR
125.0000 mg | Freq: Once | INTRAMUSCULAR | Status: AC
  Administered 2020-12-24: 125 mg via INTRAVENOUS
  Filled 2020-12-24: qty 2

## 2020-12-24 MED ORDER — PREDNISONE 20 MG PO TABS: 40.0000 mg | ORAL_TABLET | Freq: Every day | ORAL | 0 refills | Status: DC

## 2020-12-24 MED ORDER — HYDROMORPHONE HCL 1 MG/ML IJ SOLN
0.5000 mg | Freq: Once | INTRAMUSCULAR | Status: AC
  Administered 2020-12-24: 0.5 mg via INTRAVENOUS
  Filled 2020-12-24: qty 1

## 2020-12-24 MED ORDER — OXYCODONE-ACETAMINOPHEN 5-325 MG PO TABS: 1.0000 | ORAL_TABLET | Freq: Four times a day (QID) | ORAL | 0 refills | Status: DC | PRN

## 2020-12-24 MED ORDER — LORAZEPAM 2 MG/ML IJ SOLN: 0.5000 mg | Freq: Once | INTRAMUSCULAR | Status: DC

## 2020-12-24 NOTE — ED Provider Notes (Signed)
  Prescription electronically prescribed for #20 Percocet 5/325 mg and prednisone 20 mg 2 tabs daily at request of Dr. Roderic Palau due to malfunction of his prescribing application ([employer])   Kem Parkinson, PA-C 12/24/20 1601    Milton Ferguson, MD 12/25/20 [phone]

## 2020-12-24 NOTE — ED Notes (Signed)
Patient placed on 2 lpm via [place] due to oxygen saturation dropping to 86% on RA while the patient falls asleep.  Provider made aware.

## 2020-12-24 NOTE — Discharge Instructions (Signed)
Follow up with [facility] neurosurgery

## 2020-12-24 NOTE — ED Notes (Signed)
Patient transported to MRI 

## 2020-12-24 NOTE — ED Triage Notes (Signed)
Pt was in the shower this morning when she threw her back out. Pt took 1 tramadol 1 robaxin w/ no relief.

## 2020-12-28 ENCOUNTER — Other Ambulatory Visit (HOSPITAL_COMMUNITY): Payer: Self-pay

## 2020-12-29 ENCOUNTER — Other Ambulatory Visit (HOSPITAL_COMMUNITY): Payer: Self-pay

## 2020-12-30 ENCOUNTER — Other Ambulatory Visit (HOSPITAL_COMMUNITY): Payer: Self-pay

## 2021-01-01 DIAGNOSIS — S39012A Strain of muscle, fascia and tendon of lower back, initial encounter: Secondary | ICD-10-CM | POA: Diagnosis not present

## 2021-01-01 DIAGNOSIS — Z6830 Body mass index (BMI) 30.0-30.9, adult: Secondary | ICD-10-CM | POA: Diagnosis not present

## 2021-01-02 ENCOUNTER — Other Ambulatory Visit (HOSPITAL_COMMUNITY): Payer: Self-pay

## 2021-01-02 NOTE — ED Provider Notes (Signed)
[facility] EMERGENCY DEPARTMENT Provider Note   CSN: [id number] Arrival date & time: 12/24/20  1941     History Chief Complaint  Patient presents with   Back Pain    Lauren Hansen is a 65 y.o. female.  Patient complains of lower back pain.  Worse pain seems to be unamenable to spine  The history is provided by the patient and medical records. No language interpreter was used.  Back Pain Location:  Generalized Quality:  Aching Radiates to:  Does not radiate Pain severity:  Moderate Onset quality:  Sudden Timing:  Constant Progression:  Worsening Chronicity:  New Context: not emotional stress   Relieved by:  Nothing Exacerbated by: Movement. Associated symptoms: no abdominal pain, no chest pain and no headaches       Past Medical History:  Diagnosis Date   Atypical nevus 07/24/2001   Right Cheek-Slight to Moderate   Atypical nevus 06/30/2017   Left Scapula-Moderate   Cervical radiculopathy    Chronic neck and back pain    Dysplastic nevus 07/24/2001   Right Abdomen   Lumbar radiculopathy    Plantar fasciitis     Patient Active Problem List   Diagnosis Date Noted   ALLERGIC RHINITIS 06/03/2008   CELLULITIS, THIGH, RIGHT 06/03/2008   OBESITY 07/20/2007   HYPERLIPIDEMIA 01/16/2007   CARPAL TUNNEL SYNDROME 01/16/2007   NECK PAIN 01/16/2007   BACK PAIN 01/16/2007   NEPHROLITHIASIS, HX OF 01/16/2007    Past Surgical History:  Procedure Laterality Date   ABDOMINAL HYSTERECTOMY     APPENDECTOMY     COLONOSCOPY N/A 05/11/2012   Procedure: COLONOSCOPY;  Surgeon: Rogene Houston, MD;  [facility];  Service: Endoscopy;  Laterality: N/A;  1030   EXTRACORPOREAL SHOCK WAVE LITHOTRIPSY Left 06/28/2019   Procedure: EXTRACORPOREAL SHOCK WAVE LITHOTRIPSY (ESWL);  Surgeon: Alexis Frock, MD;  [facility];  Service: Urology;  Laterality: Left;   right hand surg        OB History   No obstetric history on file.     Family  History  Family history unknown: Yes    Social History   Tobacco Use   Smoking status: Never   Smokeless tobacco: Never  Vaping Use   Vaping Use: Never used  Substance Use Topics   Alcohol use: Yes    Comment: social use   Drug use: No    Home Medications Prior to Admission medications   Medication Sig Start Date End Date Taking? Authorizing Provider  meloxicam (MOBIC) 7.5 MG tablet Take 1 tablet (7.5 mg total) by mouth every 12 (twelve) hours. 07/24/20  Yes Darr, Edison Nasuti, PA-C  metFORMIN (GLUCOPHAGE) 500 MG tablet Take 1 tablet (500 mg total) by mouth daily. 10/22/20  Yes   methocarbamol (ROBAXIN) 500 MG tablet Take 1 tablet (500 mg total) by mouth 3 (three) times daily. 08/25/20  Yes   oxyCODONE-acetaminophen (PERCOCET/ROXICET) 5-325 MG tablet Take 1 tablet by mouth every 6 (six) hours as needed for severe pain. 12/24/20  Yes Triplett, Tammy, PA-C  predniSONE (DELTASONE) 20 MG tablet Take 2 tablets (40 mg total) by mouth daily. 12/24/20  Yes Triplett, Tammy, PA-C  ibuprofen (ADVIL) 600 MG tablet Take 1 tablet (600 mg total) by mouth every 6 (six) hours as needed. Patient not taking: No sig reported 03/13/20   Melynda Ripple, MD  ondansetron (ZOFRAN ODT) 8 MG disintegrating tablet 1/2- 1 tablet q 8 hr prn nausea, vomiting Patient not taking: No sig reported 03/13/20  Melynda Ripple, MD  pantoprazole (PROTONIX) 20 MG tablet Take 1 tablet (20 mg total) by mouth daily. Patient not taking: No sig reported 03/13/20   Melynda Ripple, MD    Allergies    Patient has no known allergies.  Review of Systems   Review of Systems  Constitutional:  Negative for appetite change and fatigue.  HENT:  Negative for congestion, ear discharge and sinus pressure.   Eyes:  Negative for discharge.  Respiratory:  Negative for cough.   Cardiovascular:  Negative for chest pain.  Gastrointestinal:  Negative for abdominal pain and diarrhea.  Genitourinary:  Negative for frequency and hematuria.   Musculoskeletal:  Positive for back pain.  Skin:  Negative for rash.  Neurological:  Negative for seizures and headaches.  Psychiatric/Behavioral:  Negative for hallucinations.    Physical Exam Updated Vital Signs BP 140/72 (BP Location: Right Arm)   Pulse 62   Temp 97.8 F (36.6 C) (Oral)   Resp 16   Ht 5\' 6"  (1.676 m)   Wt 89.8 kg   SpO2 96%   BMI 31.96 kg/m   Physical Exam Vitals and nursing note reviewed.  Constitutional:      Appearance: She is well-developed.  HENT:     Head: Normocephalic.     Nose: Nose normal.  Eyes:     General: No scleral icterus.    Conjunctiva/sclera: Conjunctivae normal.  Neck:     Thyroid: No thyromegaly.  Cardiovascular:     Rate and Rhythm: Normal rate and regular rhythm.     Heart sounds: No murmur heard.   No friction rub. No gallop.  Pulmonary:     Breath sounds: No stridor. No wheezing or rales.  Chest:     Chest wall: No tenderness.  Abdominal:     General: There is no distension.     Tenderness: There is no abdominal tenderness. There is no rebound.  Musculoskeletal:        General: Normal range of motion.     Cervical back: Neck supple.     Comments: Tender lumbar spine.  Lymphadenopathy:     Cervical: No cervical adenopathy.  Skin:    Findings: No erythema or rash.  Neurological:     Mental Status: She is alert and oriented to person, place, and time.     Motor: No abnormal muscle tone.     Coordination: Coordination normal.  Psychiatric:        Behavior: Behavior normal.    ED Results / Procedures / Treatments   Labs (all labs ordered are listed, but only abnormal results are displayed) Labs Reviewed  URINALYSIS, ROUTINE W REFLEX MICROSCOPIC - Abnormal; Notable for the following components:      Result Value   Leukocytes,Ua TRACE (*)    Bacteria, UA RARE (*)    All other components within normal limits  CBC WITH DIFFERENTIAL/PLATELET    EKG None  Radiology No results found.  Procedures Procedures    Medications Ordered in ED Medications  methylPREDNISolone sodium succinate (SOLU-MEDROL) 125 mg/2 mL injection 125 mg (125 mg Intravenous Given 12/24/20 1131)  HYDROmorphone (DILAUDID) injection 0.5 mg (0.5 mg Intravenous Given 12/24/20 1128)  ondansetron (ZOFRAN) injection 4 mg (4 mg Intravenous Given 12/24/20 1125)  HYDROmorphone (DILAUDID) injection 0.5 mg (0.5 mg Intravenous Given 12/24/20 1255)    ED Course  I have reviewed the triage vital signs and the nursing notes.  Pertinent labs & imaging results that were available during my care of the patient were  reviewed by me and considered in my medical decision making (see chart for details).    MDM Rules/Calculators/A&P                           Patient with back pain and L3-L4 disc bulge.  She is sent home on prednisone and Percocet and referred to neurosurgery Final Clinical Impression(s) / ED Diagnoses Final diagnoses:  Acute right-sided low back pain with sciatica, sciatica laterality unspecified    Rx / DC Orders ED Discharge Orders          Ordered    Comprehensive metabolic panel        12/15/11 1109    oxyCODONE-acetaminophen (PERCOCET/ROXICET) 5-325 MG tablet  Every 6 hours PRN        12/24/20 1558    predniSONE (DELTASONE) 20 MG tablet  Daily        12/24/20 1558             Milton Ferguson, MD 01/02/21 1030

## 2021-01-05 DIAGNOSIS — M545 Low back pain, unspecified: Secondary | ICD-10-CM | POA: Diagnosis not present

## 2021-01-06 ENCOUNTER — Other Ambulatory Visit (HOSPITAL_COMMUNITY): Payer: Self-pay

## 2021-01-07 ENCOUNTER — Other Ambulatory Visit (HOSPITAL_COMMUNITY): Payer: Self-pay

## 2021-01-07 MED ORDER — MELOXICAM 7.5 MG PO TABS: 7.5000 mg | ORAL_TABLET | Freq: Every day | ORAL | 1 refills | Status: DC

## 2021-01-07 MED ORDER — MELOXICAM 7.5 MG PO TABS
7.5000 mg | ORAL_TABLET | Freq: Every day | ORAL | 1 refills | Status: DC
  Filled 2021-01-07: qty 90, 90d supply, fill #0
  Filled 2021-04-04: qty 90, 90d supply, fill #1

## 2021-01-12 ENCOUNTER — Other Ambulatory Visit (HOSPITAL_COMMUNITY): Payer: Self-pay | Admitting: Internal Medicine

## 2021-01-12 DIAGNOSIS — Z1231 Encounter for screening mammogram for malignant neoplasm of breast: Secondary | ICD-10-CM

## 2021-01-19 ENCOUNTER — Other Ambulatory Visit: Payer: Self-pay

## 2021-01-19 ENCOUNTER — Ambulatory Visit (HOSPITAL_COMMUNITY)
Admission: RE | Admit: 2021-01-19 | Discharge: 2021-01-19 | Disposition: A | Discharge status: Home / Self Care | Payer: 59 | Source: Ambulatory Visit | Attending: Internal Medicine | Admitting: Internal Medicine

## 2021-01-19 DIAGNOSIS — Z1231 Encounter for screening mammogram for malignant neoplasm of breast: Secondary | ICD-10-CM

## 2021-01-20 ENCOUNTER — Encounter (HOSPITAL_COMMUNITY): Payer: Self-pay | Admitting: Physical Therapy

## 2021-01-20 ENCOUNTER — Ambulatory Visit (HOSPITAL_COMMUNITY): Payer: 59 | Attending: Neurosurgery | Admitting: Physical Therapy

## 2021-01-20 DIAGNOSIS — M545 Low back pain, unspecified: Secondary | ICD-10-CM | POA: Insufficient documentation

## 2021-01-20 NOTE — Patient Instructions (Signed)
Access Code: [email or web address] URL: [email or web address] Date: 01/20/2021 Prepared by: Josue Hector  Exercises Hooklying Single Knee to Chest Stretch - 2 x daily - 7 x weekly - 1 sets - 10 reps - 5 second hold Lower Trunk Rotations - 2 x daily - 7 x weekly - 1 sets - 10 reps - 5 second hold Supine Bridge - 2 x daily - 7 x weekly - 2 sets - 10 reps - 5 second hold Supine Transversus Abdominis Bracing - Hands on Stomach - 2 x daily - 7 x weekly - 2 sets - 10 reps - 5 second hold Supine March - 2 x daily - 7 x weekly - 2 sets - 10 reps - 5 second hold

## 2021-01-20 NOTE — Therapy (Signed)
[place], [place], [address] Phone: [phone]   Fax:  [phone]  Physical Therapy Evaluation  Patient Details  Name: Lauren Hansen MRN: [id number] Date of Birth: [birth year] Referring Provider (PT): Earnie Larsson MD   Encounter Date: 01/20/2021   PT End of Session - 01/20/21 1506     Visit Number 1    Number of Visits 1    Date for PT Re-Evaluation 01/20/21    Authorization Type [facility] [employer]    PT Start Time 1435    PT Stop Time 1515    PT Time Calculation (min) 40 min    Activity Tolerance Patient tolerated treatment well    Behavior During Therapy [facility] for tasks assessed/performed             Past Medical History:  Diagnosis Date   Atypical nevus 07/24/2001   Right Cheek-Slight to Moderate   Atypical nevus 06/30/2017   Left Scapula-Moderate   Cervical radiculopathy    Chronic neck and back pain    Dysplastic nevus 07/24/2001   Right Abdomen   Lumbar radiculopathy    Plantar fasciitis     Past Surgical History:  Procedure Laterality Date   ABDOMINAL HYSTERECTOMY     APPENDECTOMY     COLONOSCOPY N/A 05/11/2012   Procedure: COLONOSCOPY;  Surgeon: Rogene Houston, MD;  [facility];  Service: Endoscopy;  Laterality: N/A;  1030   EXTRACORPOREAL SHOCK WAVE LITHOTRIPSY Left 06/28/2019   Procedure: EXTRACORPOREAL SHOCK WAVE LITHOTRIPSY (ESWL);  Surgeon: Alexis Frock, MD;  Location: [facility];  Service: Urology;  Laterality: Left;   right hand surg       There were no vitals filed for this visit.    Subjective Assessment - 01/20/21 1444     Subjective Patient presents to therapy with complaint of lumbar pain. She reports history of bulging discs and had recent flare up about a month ago. Noticed increased pain when bending over in shower. Couldn't move afterwards. She was given steroids which helped.    Limitations Lifting;Standing;House hold activities;Walking     Patient Stated Goals Get some exercies to do at home    Currently in Pain? Yes    Pain Score 2     Pain Location Back    Pain Orientation Lower    Pain Descriptors / Indicators Aching;Dull    Pain Type Acute pain    Pain Onset More than a month ago    Pain Frequency Intermittent    Aggravating Factors  bending, lifting, prolonged standing    Pain Relieving Factors rest, meds    Effect of Pain on Daily Activities Limits                [facility] PT Assessment - 01/20/21 0001       Assessment   Medical Diagnosis LBP    Referring Provider (PT) Earnie Larsson MD    Prior Therapy yes      Precautions   Precautions None      Restrictions   Weight Bearing Restrictions No      Balance Screen   Has the patient fallen in the past 6 months Yes    How many times? 1    Has the patient had a decrease in activity level because of a fear of falling?  No    Is the patient reluctant to leave their home because of a fear of falling?  No  [place] residence      Prior Function   Level of Independence Independent    Vocation Full time employment    Vocation Requirements LPN at [employer]   Overall Cognitive Status Within Functional Limits for tasks assessed      Observation/Other Assessments   Focus on Therapeutic Outcomes (FOTO)  70% function      ROM / Strength   AROM / PROM / Strength AROM;Strength      AROM   AROM Assessment Site Lumbar    Lumbar Flexion 50% limited    Lumbar Extension 90% limited    Lumbar - Right Side Bend 80% limited   pain   Lumbar - Left Side Bend 80% limited   pain     Strength   Strength Assessment Site Hip;Knee;Ankle    Right/Left Hip Right;Left    Right Hip Flexion 5/5    Right Hip Extension 3-/5    Right Hip ABduction 4+/5    Left Hip Flexion 5/5    Left Hip Extension 3-/5    Left Hip ABduction 4+/5    Right/Left Knee Right;Left    Right Knee Extension 5/5    Left Knee Extension 5/5     Right/Left Ankle Right;Left    Right Ankle Dorsiflexion 5/5    Left Ankle Dorsiflexion 5/5      Palpation   Palpation comment Mod TTP about lumbar paraspinals                        Objective measurements completed on examination: See above findings.       [facility] Adult PT Treatment/Exercise - 01/20/21 0001       Exercises   Exercises Lumbar      Lumbar Exercises: Stretches   Single Knee to Chest Stretch 3 reps;10 seconds    Lower Trunk Rotation 10 seconds;3 reps      Lumbar Exercises: Supine   Ab Set 10 reps    Bent Knee Raise 10 reps    Bridge 10 reps    Straight Leg Raise --                     PT Education - 01/20/21 1448     Education Details on evalutation findings, POC and HEP    Person(s) Educated Patient    Methods Explanation;Handout    Comprehension Verbalized understanding              PT Short Term Goals - 01/20/21 1517       PT SHORT TERM GOAL #1   Title Patient will be independent with initial HEP and self-management strategies to improve functional outcomes    Baseline Reviewed HEP, answered all questions, issued handout    Status Achieved    Target Date 01/20/21                       Plan - 01/20/21 1507     Clinical Impression Statement Patient is a 65 y.o. female who presents to physical therapy with complaint of LBP. Patient demonstrates decreased strength, ROM restriction and increased tenderness to palpation which are likely contributing to symptoms of pain and are negatively impacting patient ability to perform ADLs and functional mobility tasks. Patient request 1 x eval only for HEP development to address areas of restriction. Educated patient on appropriate exercise, and issued HEP handout. Answered all patient  questions and encouraged to follow up with therapy services with any further issues or concerns.    Examination-Activity Limitations Caring for  Others;Squat;Bend;Stairs;Sit;Lift;Locomotion Level;Stand    Examination-Participation Restrictions Community Activity;Occupation;Yard Work;Laundry;Cleaning;Shop    Stability/Clinical Decision Making Stable/Uncomplicated    Clinical Decision Making Low    Rehab Potential Good    PT Frequency 1x / week    PT Duration --   1 x eval only   PT Treatment/Interventions ADLs/Self Care Home Management;Therapeutic activities;Therapeutic exercise;Patient/family education    PT Next Visit Plan 1 x eval only. Follow up as needed    PT Home Exercise Plan Access Code: [email or web address]    Consulted and Agree with Plan of Care Patient             Patient will benefit from skilled therapeutic intervention in order to improve the following deficits and impairments:  Decreased range of motion, Improper body mechanics, Impaired flexibility, Postural dysfunction, Increased fascial restricitons, Pain, Decreased strength, Decreased activity tolerance  Visit Diagnosis: Low back pain, unspecified back pain laterality, unspecified chronicity, unspecified whether sciatica present     Problem List Patient Active Problem List   Diagnosis Date Noted   ALLERGIC RHINITIS 06/03/2008   CELLULITIS, THIGH, RIGHT 06/03/2008   OBESITY 07/20/2007   HYPERLIPIDEMIA 01/16/2007   CARPAL TUNNEL SYNDROME 01/16/2007   NECK PAIN 01/16/2007   BACK PAIN 01/16/2007   NEPHROLITHIASIS, HX OF 01/16/2007   3:21 PM, 01/20/21 Josue Hector PT DPT  Physical Therapist with [facility]  [phone] [place], [place], [address] Phone: [phone]   Fax:  [phone]  Name: Lauren Hansen MRN: [id number] Date of Birth: 25-Jul-1955

## 2021-02-09 ENCOUNTER — Other Ambulatory Visit (HOSPITAL_COMMUNITY): Payer: Self-pay

## 2021-02-11 ENCOUNTER — Other Ambulatory Visit (HOSPITAL_COMMUNITY): Payer: Self-pay

## 2021-02-11 MED ORDER — METHOCARBAMOL 500 MG PO TABS
500.0000 mg | ORAL_TABLET | Freq: Three times a day (TID) | ORAL | 2 refills | Status: AC
  Filled 2021-02-11: qty 60, 20d supply, fill #0
  Filled 2021-05-08: qty 60, 20d supply, fill #1
  Filled 2021-08-12: qty 60, 20d supply, fill #2

## 2021-04-06 ENCOUNTER — Other Ambulatory Visit (HOSPITAL_COMMUNITY): Payer: Self-pay

## 2021-05-08 ENCOUNTER — Other Ambulatory Visit (HOSPITAL_COMMUNITY): Payer: Self-pay

## 2021-05-08 MED ORDER — MELOXICAM 7.5 MG PO TABS
7.5000 mg | ORAL_TABLET | Freq: Every day | ORAL | 0 refills | Status: DC
  Filled 2021-05-08 – 2021-07-21 (×2): qty 90, 90d supply, fill #0

## 2021-05-27 DIAGNOSIS — R062 Wheezing: Secondary | ICD-10-CM | POA: Diagnosis not present

## 2021-05-27 DIAGNOSIS — R059 Cough, unspecified: Secondary | ICD-10-CM | POA: Diagnosis not present

## 2021-05-27 DIAGNOSIS — R0989 Other specified symptoms and signs involving the circulatory and respiratory systems: Secondary | ICD-10-CM | POA: Diagnosis not present

## 2021-05-27 DIAGNOSIS — R519 Headache, unspecified: Secondary | ICD-10-CM | POA: Diagnosis not present

## 2021-06-03 ENCOUNTER — Other Ambulatory Visit (HOSPITAL_COMMUNITY): Payer: Self-pay | Admitting: Family Medicine

## 2021-06-03 DIAGNOSIS — E559 Vitamin D deficiency, unspecified: Secondary | ICD-10-CM | POA: Diagnosis not present

## 2021-06-03 DIAGNOSIS — E785 Hyperlipidemia, unspecified: Secondary | ICD-10-CM | POA: Diagnosis not present

## 2021-06-03 DIAGNOSIS — E119 Type 2 diabetes mellitus without complications: Secondary | ICD-10-CM | POA: Diagnosis not present

## 2021-06-03 DIAGNOSIS — R059 Cough, unspecified: Secondary | ICD-10-CM

## 2021-06-05 ENCOUNTER — Ambulatory Visit (HOSPITAL_COMMUNITY)
Admission: RE | Admit: 2021-06-05 | Discharge: 2021-06-05 | Disposition: A | Discharge status: Home / Self Care | Payer: 59 | Source: Ambulatory Visit | Attending: Family Medicine | Admitting: Family Medicine

## 2021-06-05 DIAGNOSIS — R059 Cough, unspecified: Secondary | ICD-10-CM | POA: Insufficient documentation

## 2021-06-10 ENCOUNTER — Other Ambulatory Visit (HOSPITAL_COMMUNITY): Payer: Self-pay

## 2021-06-10 DIAGNOSIS — M25562 Pain in left knee: Secondary | ICD-10-CM | POA: Diagnosis not present

## 2021-06-10 DIAGNOSIS — E119 Type 2 diabetes mellitus without complications: Secondary | ICD-10-CM | POA: Diagnosis not present

## 2021-06-10 DIAGNOSIS — E559 Vitamin D deficiency, unspecified: Secondary | ICD-10-CM | POA: Diagnosis not present

## 2021-06-10 DIAGNOSIS — E785 Hyperlipidemia, unspecified: Secondary | ICD-10-CM | POA: Diagnosis not present

## 2021-06-10 MED ORDER — OZEMPIC (0.25 OR 0.5 MG/DOSE) 2 MG/3ML ~~LOC~~ SOPN
0.2500 mg | PEN_INJECTOR | SUBCUTANEOUS | 2 refills | Status: DC
Start: 2021-06-10 — End: 2021-12-08
  Filled 2021-06-10: qty 3, 56d supply, fill #0
  Filled 2021-07-27: qty 3, 56d supply, fill #1
  Filled 2021-09-24: qty 3, 56d supply, fill #2

## 2021-06-26 DIAGNOSIS — R0981 Nasal congestion: Secondary | ICD-10-CM | POA: Diagnosis not present

## 2021-06-26 DIAGNOSIS — R509 Fever, unspecified: Secondary | ICD-10-CM | POA: Diagnosis not present

## 2021-06-26 DIAGNOSIS — J029 Acute pharyngitis, unspecified: Secondary | ICD-10-CM | POA: Diagnosis not present

## 2021-06-26 DIAGNOSIS — J019 Acute sinusitis, unspecified: Secondary | ICD-10-CM | POA: Diagnosis not present

## 2021-06-26 DIAGNOSIS — R059 Cough, unspecified: Secondary | ICD-10-CM | POA: Diagnosis not present

## 2021-07-06 ENCOUNTER — Ambulatory Visit
Admission: EM | Admit: 2021-07-06 | Discharge: 2021-07-06 | Disposition: A | Discharge status: Home / Self Care | Payer: 59 | Attending: Nurse Practitioner | Admitting: Nurse Practitioner

## 2021-07-06 DIAGNOSIS — J069 Acute upper respiratory infection, unspecified: Secondary | ICD-10-CM | POA: Diagnosis not present

## 2021-07-06 MED ORDER — DOXYCYCLINE HYCLATE 100 MG PO TABS: 100.0000 mg | ORAL_TABLET | Freq: Two times a day (BID) | ORAL | 0 refills | Status: AC

## 2021-07-06 MED ORDER — PSEUDOEPH-BROMPHEN-DM 30-2-10 MG/5ML PO SYRP: 5.0000 mL | ORAL_SOLUTION | Freq: Four times a day (QID) | ORAL | 0 refills | Status: DC | PRN

## 2021-07-06 MED ORDER — FLUTICASONE PROPIONATE 50 MCG/ACT NA SUSP: 2.0000 | Freq: Every day | NASAL | 0 refills | Status: AC

## 2021-07-06 NOTE — ED Triage Notes (Signed)
Pt reports cough, nasal congestion, chest congestion, sore throat ad headache x 1 month. Pt has been seeing by her PCP 2 times for this complaints, states she did not take the Augmentin as makes her vomit.

## 2021-07-06 NOTE — ED Provider Notes (Signed)
[facility]    CSN: [id number] Arrival date & time: 07/06/21  0859      History   Chief Complaint Chief Complaint  Patient presents with   Cough   Nasal Congestion    HPI Lauren Hansen is a 66 y.o. female.   The patient is a 66 year old female who presents with upper respiratory symptoms. She reports cough, nasal congestion, chest congestion, sore throat ad headache x 1 month.  She states that she had a fever 1 day ago that was over 100.  She denies ear pain, wheezing, shortness of breath, difficulty breathing, or GI symptoms.  The patient has been seen by her PCP 2 times for these symptoms.  She states she was given an inhaler because she had wheezing, when she followed up again, she was prescribed Augmentin.  States that she did not take the Augmentin because it made her vomit.  Dates that she did have some doxycycline left that she took for 2 days before she ran out.  The history is provided by the patient.   Past Medical History:  Diagnosis Date   Atypical nevus 07/24/2001   Right Cheek-Slight to Moderate   Atypical nevus 06/30/2017   Left Scapula-Moderate   Cervical radiculopathy    Chronic neck and back pain    Dysplastic nevus 07/24/2001   Right Abdomen   Lumbar radiculopathy    Plantar fasciitis     Patient Active Problem List   Diagnosis Date Noted   ALLERGIC RHINITIS 06/03/2008   CELLULITIS, THIGH, RIGHT 06/03/2008   OBESITY 07/20/2007   HYPERLIPIDEMIA 01/16/2007   CARPAL TUNNEL SYNDROME 01/16/2007   NECK PAIN 01/16/2007   BACK PAIN 01/16/2007   NEPHROLITHIASIS, HX OF 01/16/2007    Past Surgical History:  Procedure Laterality Date   ABDOMINAL HYSTERECTOMY     APPENDECTOMY     COLONOSCOPY N/A 05/11/2012   Procedure: COLONOSCOPY;  Surgeon: Rogene Houston, MD;  [facility];  Service: Endoscopy;  Laterality: N/A;  1030   EXTRACORPOREAL SHOCK WAVE LITHOTRIPSY Left 06/28/2019   Procedure: EXTRACORPOREAL SHOCK WAVE LITHOTRIPSY  (ESWL);  Surgeon: Alexis Frock, MD;  Location: [facility];  Service: Urology;  Laterality: Left;   right hand surg       OB History   No obstetric history on file.      Home Medications    Prior to Admission medications   Medication Sig Start Date End Date Taking? Authorizing Provider  brompheniramine-pseudoephedrine-DM 30-2-10 MG/5ML syrup Take 5 mLs by mouth 4 (four) times daily as needed. 07/06/21  Yes Karynn Deblasi-Warren, Alda Lea, NP  doxycycline (VIBRA-TABS) 100 MG tablet Take 1 tablet (100 mg total) by mouth 2 (two) times daily for 10 days. 07/06/21 07/16/21 Yes Zac Torti-Warren, Alda Lea, NP  fluticasone (FLONASE) 50 MCG/ACT nasal spray Place 2 sprays into both nostrils daily. 07/06/21  Yes Demari Kropp-Warren, Alda Lea, NP  ibuprofen (ADVIL) 600 MG tablet Take 1 tablet (600 mg total) by mouth every 6 (six) hours as needed. Patient not taking: Reported on 12/24/2020 03/13/20   Melynda Ripple, MD  meloxicam (MOBIC) 7.5 MG tablet Take 1 tablet (7.5 mg total) by mouth daily. 05/08/21     metFORMIN (GLUCOPHAGE) 500 MG tablet Take 1 tablet (500 mg total) by mouth daily. 10/22/20     methocarbamol (ROBAXIN) 500 MG tablet Take 1 tablet (500 mg total) by mouth 3 (three) times daily. 02/11/21     ondansetron (ZOFRAN ODT) 8 MG disintegrating tablet 1/2- 1 tablet q  8 hr prn nausea, vomiting Patient not taking: Reported on 07/02/2020 03/13/20   Melynda Ripple, MD  oxyCODONE-acetaminophen (PERCOCET/ROXICET) 5-325 MG tablet Take 1 tablet by mouth every 6 (six) hours as needed for severe pain. 12/24/20   Triplett, Tammy, PA-C  pantoprazole (PROTONIX) 20 MG tablet Take 1 tablet (20 mg total) by mouth daily. Patient not taking: Reported on 07/02/2020 03/13/20   Melynda Ripple, MD  predniSONE (DELTASONE) 20 MG tablet Take 2 tablets (40 mg total) by mouth daily. 12/24/20   Triplett, Tammy, PA-C  Semaglutide,0.25 or 0.'5MG'$ /DOS, (OZEMPIC, 0.25 OR 0.5 MG/DOSE,) 2 MG/3ML SOPN Inject 0.25 mg into the skin  once a week. 06/10/21       Family History Family History  Family history unknown: Yes    Social History Social History   Tobacco Use   Smoking status: Never   Smokeless tobacco: Never  Vaping Use   Vaping Use: Never used  Substance Use Topics   Alcohol use: Yes    Comment: social use   Drug use: No     Allergies   Patient has no known allergies.   Review of Systems Review of Systems PER HPI  Physical Exam Triage Vital Signs ED Triage Vitals  Enc Vitals Group     BP 07/06/21 1019 128/71     Pulse Rate 07/06/21 1019 72     Resp 07/06/21 1019 18     Temp 07/06/21 1019 98.9 F (37.2 C)     Temp Source 07/06/21 1019 Oral     SpO2 07/06/21 1019 94 %     Weight --      Height --      Head Circumference --      Peak Flow --      Pain Score 07/06/21 1021 8     Pain Loc --      Pain Edu? --      Excl. in [place]? --    No data found.  Updated Vital Signs BP 128/71 (BP Location: Right Arm)   Pulse 72   Temp 98.9 F (37.2 C) (Oral)   Resp 18   SpO2 94%   Visual Acuity Right Eye Distance:   Left Eye Distance:   Bilateral Distance:    Right Eye Near:   Left Eye Near:    Bilateral Near:     Physical Exam Vitals and nursing note reviewed.  Constitutional:      General: She is not in acute distress.    Appearance: Normal appearance. She is well-developed.  HENT:     Head: Normocephalic and atraumatic.     Right Ear: Tympanic membrane, ear canal and external ear normal.     Left Ear: Tympanic membrane, ear canal and external ear normal.     Nose: Congestion present.     Right Turbinates: Enlarged and swollen.     Left Turbinates: Enlarged and swollen.     Right Sinus: No maxillary sinus tenderness or frontal sinus tenderness.     Left Sinus: No maxillary sinus tenderness or frontal sinus tenderness.     Mouth/Throat:     Mouth: Mucous membranes are moist.  Eyes:     Conjunctiva/sclera: Conjunctivae normal.     Pupils: Pupils are equal, round, and  reactive to light.  Neck:     Thyroid: No thyromegaly.     Trachea: No tracheal deviation.  Cardiovascular:     Rate and Rhythm: Normal rate and regular rhythm.     Heart sounds: Normal heart  sounds.  Pulmonary:     Effort: Pulmonary effort is normal.     Breath sounds: Normal breath sounds.  Abdominal:     General: Bowel sounds are normal. There is no distension.     Palpations: Abdomen is soft.     Tenderness: There is no abdominal tenderness.  Musculoskeletal:     Cervical back: Normal range of motion and neck supple.  Skin:    General: Skin is warm and dry.  Neurological:     General: No focal deficit present.     Mental Status: She is alert and oriented to person, place, and time.  Psychiatric:        Mood and Affect: Mood normal.        Behavior: Behavior normal.        Thought Content: Thought content normal.        Judgment: Judgment normal.     UC Treatments / Results  Labs (all labs ordered are listed, but only abnormal results are displayed) Labs Reviewed - No data to display  EKG   Radiology No results found.  Procedures Procedures (including critical care time)  Medications Ordered in UC Medications - No data to display  Initial Impression / Assessment and Plan / UC Course  I have reviewed the triage vital signs and the nursing notes.  Pertinent labs & imaging results that were available during my care of the patient were reviewed by me and considered in my medical decision making (see chart for details).  Symptoms have been persistent x 1 month. Symptoms have waxed and waned.   Otherwise vital signs and exam are reassuring.  No point-of-care testing was sent today as this will not change the course of treatment and also due to the duration of the patient's symptoms.  Recommended over-the-counter fever reducers and supportive care.  We will start the patient on doxycycline, Bromfed, and fluticasone for her symptoms.  Strict return precautions were  provided, the patient was advised to follow-up as needed.  Final Clinical Impressions(s) / UC Diagnoses   Final diagnoses:  Acute upper respiratory infection     Discharge Instructions      Take medication as prescribed. Allow for rest and plenty of fluids. May take over-the-counter ibuprofen or Tylenol for pain, fever, or general discomfort. If symptoms do not improve or if they worsen, you can follow-up with our clinic or with your primary care physician.     ED Prescriptions     Medication Sig Dispense Auth. Provider   doxycycline (VIBRA-TABS) 100 MG tablet Take 1 tablet (100 mg total) by mouth 2 (two) times daily for 10 days. 20 tablet Tyse Auriemma-Warren, Alda Lea, NP   brompheniramine-pseudoephedrine-DM 30-2-10 MG/5ML syrup Take 5 mLs by mouth 4 (four) times daily as needed. 140 mL Jalisa Sacco-Warren, Alda Lea, NP   fluticasone (FLONASE) 50 MCG/ACT nasal spray Place 2 sprays into both nostrils daily. 16 g Merve Hotard-Warren, Alda Lea, NP      PDMP not reviewed this encounter.   Tish Men, NP 07/06/21 1052

## 2021-07-06 NOTE — Discharge Instructions (Addendum)
Take medication as prescribed. Allow for rest and plenty of fluids. May take over-the-counter ibuprofen or Tylenol for pain, fever, or general discomfort. If symptoms do not improve or if they worsen, you can follow-up with our clinic or with your primary care physician.

## 2021-07-15 DIAGNOSIS — Z1211 Encounter for screening for malignant neoplasm of colon: Secondary | ICD-10-CM | POA: Diagnosis not present

## 2021-07-21 ENCOUNTER — Other Ambulatory Visit (HOSPITAL_COMMUNITY): Payer: Self-pay

## 2021-07-27 ENCOUNTER — Other Ambulatory Visit (HOSPITAL_COMMUNITY): Payer: Self-pay

## 2021-08-11 ENCOUNTER — Encounter (INDEPENDENT_AMBULATORY_CARE_PROVIDER_SITE_OTHER): Payer: Self-pay | Admitting: *Deleted

## 2021-08-12 ENCOUNTER — Other Ambulatory Visit (HOSPITAL_COMMUNITY): Payer: Self-pay

## 2021-09-02 DIAGNOSIS — E119 Type 2 diabetes mellitus without complications: Secondary | ICD-10-CM | POA: Diagnosis not present

## 2021-09-02 DIAGNOSIS — E559 Vitamin D deficiency, unspecified: Secondary | ICD-10-CM | POA: Diagnosis not present

## 2021-09-02 DIAGNOSIS — E782 Mixed hyperlipidemia: Secondary | ICD-10-CM | POA: Diagnosis not present

## 2021-09-09 ENCOUNTER — Other Ambulatory Visit (HOSPITAL_COMMUNITY): Payer: Self-pay

## 2021-09-09 DIAGNOSIS — E119 Type 2 diabetes mellitus without complications: Secondary | ICD-10-CM | POA: Diagnosis not present

## 2021-09-09 DIAGNOSIS — E559 Vitamin D deficiency, unspecified: Secondary | ICD-10-CM | POA: Diagnosis not present

## 2021-09-09 DIAGNOSIS — M25562 Pain in left knee: Secondary | ICD-10-CM | POA: Diagnosis not present

## 2021-09-09 DIAGNOSIS — Z6828 Body mass index (BMI) 28.0-28.9, adult: Secondary | ICD-10-CM | POA: Diagnosis not present

## 2021-09-09 DIAGNOSIS — R059 Cough, unspecified: Secondary | ICD-10-CM | POA: Diagnosis not present

## 2021-09-09 DIAGNOSIS — E663 Overweight: Secondary | ICD-10-CM | POA: Diagnosis not present

## 2021-09-09 DIAGNOSIS — E785 Hyperlipidemia, unspecified: Secondary | ICD-10-CM | POA: Diagnosis not present

## 2021-09-09 MED ORDER — BENZONATATE 200 MG PO CAPS
200.0000 mg | ORAL_CAPSULE | Freq: Three times a day (TID) | ORAL | 0 refills | Status: DC
  Filled 2021-09-09: qty 42, 14d supply, fill #0

## 2021-09-24 ENCOUNTER — Other Ambulatory Visit (HOSPITAL_COMMUNITY): Payer: Self-pay

## 2021-09-29 ENCOUNTER — Other Ambulatory Visit (HOSPITAL_COMMUNITY): Payer: Self-pay

## 2021-10-05 ENCOUNTER — Telehealth (INDEPENDENT_AMBULATORY_CARE_PROVIDER_SITE_OTHER): Payer: Self-pay

## 2021-10-05 ENCOUNTER — Ambulatory Visit (INDEPENDENT_AMBULATORY_CARE_PROVIDER_SITE_OTHER): Payer: 59 | Admitting: Gastroenterology

## 2021-10-05 ENCOUNTER — Encounter (INDEPENDENT_AMBULATORY_CARE_PROVIDER_SITE_OTHER): Payer: Self-pay | Admitting: Gastroenterology

## 2021-10-05 ENCOUNTER — Encounter (INDEPENDENT_AMBULATORY_CARE_PROVIDER_SITE_OTHER): Payer: Self-pay

## 2021-10-05 ENCOUNTER — Other Ambulatory Visit (INDEPENDENT_AMBULATORY_CARE_PROVIDER_SITE_OTHER): Payer: Self-pay

## 2021-10-05 DIAGNOSIS — R195 Other fecal abnormalities: Secondary | ICD-10-CM | POA: Diagnosis not present

## 2021-10-05 MED ORDER — PEG 3350-KCL-NA BICARB-NACL 420 G PO SOLR: 4000.0000 mL | ORAL | 0 refills | Status: DC

## 2021-10-05 NOTE — Patient Instructions (Signed)
Schedule colonoscopy Hold Ozempic dose a week before your procedure

## 2021-10-05 NOTE — Progress Notes (Signed)
[name], M.D. Gastroenterology & Hepatology [facility] [address] [place], [place] [address] Primary Care Physician: Celene Squibb, MD [place] [address]  Referring MD: PCP  Chief Complaint: Positive Cologuard  History of Present Illness: Lauren Hansen is a 66 y.o. female with no significant past medical history who presents for evaluation of positive Cologuard.  States that a month ago she had positive Cologuard.  The patient denies having any any complaints such as nausea, vomiting, fever, chills, hematochezia, melena, hematemesis, abdominal distention, abdominal pain, diarrhea, jaundice, pruritus. Has lost 20 lb in last 2 months while on Ozempic.   Last WGN:FAOZH Last Colonoscopy: 2014 Prep excellent Few scattered diverticula from sigmoid colon to cecum. 3 mm polyp ablated via cold biopsy from ascending colon. Normal mucosa of rectum and anorectal junction.  FHx: neg for any gastrointestinal/liver disease, breast cancer aunt Social: neg smoking, frequent alcohol or illicit drug use Surgical: appendectomy, hysterectomy  Past Medical History: Past Medical History:  Diagnosis Date   Atypical nevus 07/24/2001   Right Cheek-Slight to Moderate   Atypical nevus 06/30/2017   Left Scapula-Moderate   Cervical radiculopathy    Chronic neck and back pain    Dysplastic nevus 07/24/2001   Right Abdomen   Lumbar radiculopathy    Plantar fasciitis     Past Surgical History: Past Surgical History:  Procedure Laterality Date   ABDOMINAL HYSTERECTOMY     APPENDECTOMY     COLONOSCOPY N/A 05/11/2012   Procedure: COLONOSCOPY;  Surgeon: Rogene Houston, MD;  [facility];  Service: Endoscopy;  Laterality: N/A;  1030   EXTRACORPOREAL SHOCK WAVE LITHOTRIPSY Left 06/28/2019   Procedure: EXTRACORPOREAL SHOCK WAVE LITHOTRIPSY (ESWL);  Surgeon: Alexis Frock, MD;  Location: [facility];  Service: Urology;  Laterality: Left;   right hand surg       Family History: Family History  Family history unknown: Yes    Social History: Social History   Tobacco Use  Smoking Status Never   Passive exposure: Past  Smokeless Tobacco Never   Social History   Substance and Sexual Activity  Alcohol Use Yes   Comment: social use   Social History   Substance and Sexual Activity  Drug Use No    Allergies: No Known Allergies  Medications: Current Outpatient Medications  Medication Sig Dispense Refill   fluticasone (FLONASE) 50 MCG/ACT nasal spray Place 2 sprays into both nostrils daily. 16 g 0   KRILL OIL PO Take by mouth. One daily     meloxicam (MOBIC) 7.5 MG tablet Take 1 tablet (7.5 mg total) by mouth daily. 90 tablet 0   methocarbamol (ROBAXIN) 500 MG tablet Take 1 tablet (500 mg total) by mouth 3 (three) times daily. 60 tablet 2   Red Yeast Rice Extract (RED YEAST RICE PO) Take by mouth. About once a week     Semaglutide,0.25 or 0.'5MG'$ /DOS, (OZEMPIC, 0.25 OR 0.5 MG/DOSE,) 2 MG/3ML SOPN Inject 0.25 mg into the skin once a week. 3 mL 2   VITAMIN D PO Take by mouth. Otc one daily     No current facility-administered medications for this visit.    Review of Systems: GENERAL: negative for malaise, night sweats HEENT: No changes in hearing or vision, no nose bleeds or other nasal problems. NECK: Negative for lumps, goiter, pain and significant neck swelling RESPIRATORY: Negative for cough, wheezing CARDIOVASCULAR: Negative for chest pain, leg swelling, palpitations, orthopnea  GI: SEE HPI MUSCULOSKELETAL: Negative for joint pain or swelling, back pain, and muscle pain. SKIN: Negative for lesions, rash PSYCH: Negative for sleep disturbance, mood disorder and recent psychosocial stressors. HEMATOLOGY Negative for prolonged bleeding, bruising easily, and swollen nodes. ENDOCRINE: Negative for cold or heat intolerance, polyuria, polydipsia and goiter. NEURO:  negative for tremor, gait imbalance, syncope and seizures. The remainder of the review of systems is noncontributory.   Physical Exam: BP 124/67 (BP Location: Left Arm, Patient Position: Sitting, Cuff Size: Large)   Pulse (!) 59   Temp 98.2 F (36.8 C) (Oral)   Ht '5\' 6"'$  (1.676 m)   Wt 174 lb (78.9 kg)   BMI 28.08 kg/m  GENERAL: The patient is AO x3, in no acute distress. HEENT: Head is normocephalic and atraumatic. EOMI are intact. Mouth is well hydrated and without lesions. NECK: Supple. No masses LUNGS: Clear to auscultation. No presence of rhonchi/wheezing/rales. Adequate chest expansion HEART: RRR, normal s1 and s2. ABDOMEN: Soft, nontender, no guarding, no peritoneal signs, and nondistended. BS +. No masses. EXTREMITIES: Without any cyanosis, clubbing, rash, lesions or edema. NEUROLOGIC: AOx3, no focal motor deficit. SKIN: no jaundice, no rashes   Imaging/Labs: as above  I personally reviewed and interpreted the available labs, imaging and endoscopic files.  Impression and Plan: Lauren Hansen is a 66 y.o. female with no significant past medical history who presents for evaluation of positive Cologuard. Patient does not have any high risk factors for colorectal cancer malignancy.  She has been asymptomatic. Discussed cologuard test results in detail, specifically what it means when the test is positive or negative.  Discussed that there is a possibility that even when the test is positive there may not be a polyp found on colonoscopy. More than 50% of the office visit was dedicated to discussing the procedure, including the day of and risks involved. Patient understands what the procedure involves including the benefits and any risks. Patient understands alternatives to the proposed procedure. Risks including (but not limited to) bleeding, tearing of the lining (perforation), rupture of adjacent organs, problems with heart and lung function, infection, and medication reactions. A  small percentage of complications may require surgery, hospitalization, repeat endoscopic procedure, and/or transfusion. A small percentage of polyps and other tumors may not be seen.  - Schedule colonoscopy - Hold Ozempic dose a week before procedure  All questions were answered.      [name], MD Gastroenterology and Hepatology [facility]

## 2021-10-05 NOTE — H&P (View-Only) (Signed)
Lauren Hansen, M.D. Gastroenterology & Hepatology [facility] [address] [place], [place] [address] Primary Care Physician: Celene Squibb, MD [place] [address]  Referring MD: PCP  Chief Complaint: Positive Cologuard  History of Present Illness: BIVIANA SADDLER is a 66 y.o. female with no significant past medical history who presents for evaluation of positive Cologuard.  States that a month ago she had positive Cologuard.  The patient denies having any any complaints such as nausea, vomiting, fever, chills, hematochezia, melena, hematemesis, abdominal distention, abdominal pain, diarrhea, jaundice, pruritus. Has lost 20 lb in last 2 months while on Ozempic.   Last CHY:IFOYD Last Colonoscopy: 2014 Prep excellent Few scattered diverticula from sigmoid colon to cecum. 3 mm polyp ablated via cold biopsy from ascending colon. Normal mucosa of rectum and anorectal junction.  FHx: neg for any gastrointestinal/liver disease, breast cancer aunt Social: neg smoking, frequent alcohol or illicit drug use Surgical: appendectomy, hysterectomy  Past Medical History: Past Medical History:  Diagnosis Date   Atypical nevus 07/24/2001   Right Cheek-Slight to Moderate   Atypical nevus 06/30/2017   Left Scapula-Moderate   Cervical radiculopathy    Chronic neck and back pain    Dysplastic nevus 07/24/2001   Right Abdomen   Lumbar radiculopathy    Plantar fasciitis     Past Surgical History: Past Surgical History:  Procedure Laterality Date   ABDOMINAL HYSTERECTOMY     APPENDECTOMY     COLONOSCOPY N/A 05/11/2012   Procedure: COLONOSCOPY;  Surgeon: Rogene Houston, MD;  [facility];  Service: Endoscopy;  Laterality: N/A;  1030   EXTRACORPOREAL SHOCK WAVE LITHOTRIPSY Left 06/28/2019   Procedure: EXTRACORPOREAL SHOCK WAVE LITHOTRIPSY (ESWL);  Surgeon: Alexis Frock, MD;  [facility]: [facility];  Service: Urology;  Laterality: Left;   right hand surg       Family History: Family History  Family history unknown: Yes    Social History: Social History   Tobacco Use  Smoking Status Never   Passive exposure: Past  Smokeless Tobacco Never   Social History   Substance and Sexual Activity  Alcohol Use Yes   Comment: social use   Social History   Substance and Sexual Activity  Drug Use No    Allergies: No Known Allergies  Medications: Current Outpatient Medications  Medication Sig Dispense Refill   fluticasone (FLONASE) 50 MCG/ACT nasal spray Place 2 sprays into both nostrils daily. 16 g 0   KRILL OIL PO Take by mouth. One daily     meloxicam (MOBIC) 7.5 MG tablet Take 1 tablet (7.5 mg total) by mouth daily. 90 tablet 0   methocarbamol (ROBAXIN) 500 MG tablet Take 1 tablet (500 mg total) by mouth 3 (three) times daily. 60 tablet 2   Red Yeast Rice Extract (RED YEAST RICE PO) Take by mouth. About once a week     Semaglutide,0.25 or 0.'5MG'$ /DOS, (OZEMPIC, 0.25 OR 0.5 MG/DOSE,) 2 MG/3ML SOPN Inject 0.25 mg into the skin once a week. 3 mL 2   VITAMIN D PO Take by mouth. Otc one daily     No current facility-administered medications for this visit.    Review of Systems: GENERAL: negative for malaise, night sweats HEENT: No changes in hearing or vision, no nose bleeds or other nasal problems. NECK: Negative for lumps, goiter, pain and significant neck swelling RESPIRATORY: Negative for cough, wheezing CARDIOVASCULAR: Negative for chest pain, leg swelling, palpitations, orthopnea  GI: SEE HPI MUSCULOSKELETAL: Negative for joint pain or swelling, back pain, and muscle pain. SKIN: Negative for lesions, rash PSYCH: Negative for sleep disturbance, mood disorder and recent psychosocial stressors. HEMATOLOGY Negative for prolonged bleeding, bruising easily, and swollen nodes. ENDOCRINE: Negative for cold or heat intolerance, polyuria, polydipsia and goiter. NEURO:  negative for tremor, gait imbalance, syncope and seizures. The remainder of the review of systems is noncontributory.   Physical Exam: BP 124/67 (BP Location: Left Arm, Patient Position: Sitting, Cuff Size: Large)   Pulse (!) 59   Temp 98.2 F (36.8 C) (Oral)   Ht '5\' 6"'$  (1.676 m)   Wt 174 lb (78.9 kg)   BMI 28.08 kg/m  GENERAL: The patient is AO x3, in no acute distress. HEENT: Head is normocephalic and atraumatic. EOMI are intact. Mouth is well hydrated and without lesions. NECK: Supple. No masses LUNGS: Clear to auscultation. No presence of rhonchi/wheezing/rales. Adequate chest expansion HEART: RRR, normal s1 and s2. ABDOMEN: Soft, nontender, no guarding, no peritoneal signs, and nondistended. BS +. No masses. EXTREMITIES: Without any cyanosis, clubbing, rash, lesions or edema. NEUROLOGIC: AOx3, no focal motor deficit. SKIN: no jaundice, no rashes   Imaging/Labs: as above  I personally reviewed and interpreted the available labs, imaging and endoscopic files.  Impression and Plan: KONNI KESINGER is a 66 y.o. female with no significant past medical history who presents for evaluation of positive Cologuard. Patient does not have any high risk factors for colorectal cancer malignancy.  She has been asymptomatic. Discussed cologuard test results in detail, specifically what it means when the test is positive or negative.  Discussed that there is a possibility that even when the test is positive there may not be a polyp found on colonoscopy. More than 50% of the office visit was dedicated to discussing the procedure, including the day of and risks involved. Patient understands what the procedure involves including the benefits and any risks. Patient understands alternatives to the proposed procedure. Risks including (but not limited to) bleeding, tearing of the lining (perforation), rupture of adjacent organs, problems with heart and lung function, infection, and medication reactions. A  small percentage of complications may require surgery, hospitalization, repeat endoscopic procedure, and/or transfusion. A small percentage of polyps and other tumors may not be seen.  - Schedule colonoscopy - Hold Ozempic dose a week before procedure  All questions were answered.      Lauren Peppers, MD Gastroenterology and Hepatology [facility]

## 2021-10-05 NOTE — Telephone Encounter (Signed)
Lauren Hansen, CMA  ?

## 2021-10-05 NOTE — H&P (View-Only) (Signed)
Lauren Hansen, M.D. Gastroenterology & Hepatology [facility] [address] [place], [place] [address] Primary Care Physician: Celene Squibb, MD [place] [address]  Referring MD: PCP  Chief Complaint: Positive Cologuard  History of Present Illness: Lauren Hansen is a 66 y.o. female with no significant past medical history who presents for evaluation of positive Cologuard.  States that a month ago she had positive Cologuard.  The patient denies having any any complaints such as nausea, vomiting, fever, chills, hematochezia, melena, hematemesis, abdominal distention, abdominal pain, diarrhea, jaundice, pruritus. Has lost 20 lb in last 2 months while on Ozempic.   Last GGY:IRSWN Last Colonoscopy: 2014 Prep excellent Few scattered diverticula from sigmoid colon to cecum. 3 mm polyp ablated via cold biopsy from ascending colon. Normal mucosa of rectum and anorectal junction.  FHx: neg for any gastrointestinal/liver disease, breast cancer aunt Social: neg smoking, frequent alcohol or illicit drug use Surgical: appendectomy, hysterectomy  Past Medical History: Past Medical History:  Diagnosis Date   Atypical nevus 07/24/2001   Right Cheek-Slight to Moderate   Atypical nevus 06/30/2017   Left Scapula-Moderate   Cervical radiculopathy    Chronic neck and back pain    Dysplastic nevus 07/24/2001   Right Abdomen   Lumbar radiculopathy    Plantar fasciitis     Past Surgical History: Past Surgical History:  Procedure Laterality Date   ABDOMINAL HYSTERECTOMY     APPENDECTOMY     COLONOSCOPY N/A 05/11/2012   Procedure: COLONOSCOPY;  Surgeon: Rogene Houston, MD;  [facility];  Service: Endoscopy;  Laterality: N/A;  1030   EXTRACORPOREAL SHOCK WAVE LITHOTRIPSY Left 06/28/2019   Procedure: EXTRACORPOREAL SHOCK WAVE LITHOTRIPSY (ESWL);  Surgeon: Alexis Frock, MD;  Location: [facility];  Service: Urology;  Laterality: Left;   right hand surg       Family History: Family History  Family history unknown: Yes    Social History: Social History   Tobacco Use  Smoking Status Never   Passive exposure: Past  Smokeless Tobacco Never   Social History   Substance and Sexual Activity  Alcohol Use Yes   Comment: social use   Social History   Substance and Sexual Activity  Drug Use No    Allergies: No Known Allergies  Medications: Current Outpatient Medications  Medication Sig Dispense Refill   fluticasone (FLONASE) 50 MCG/ACT nasal spray Place 2 sprays into both nostrils daily. 16 g 0   KRILL OIL PO Take by mouth. One daily     meloxicam (MOBIC) 7.5 MG tablet Take 1 tablet (7.5 mg total) by mouth daily. 90 tablet 0   methocarbamol (ROBAXIN) 500 MG tablet Take 1 tablet (500 mg total) by mouth 3 (three) times daily. 60 tablet 2   Red Yeast Rice Extract (RED YEAST RICE PO) Take by mouth. About once a week     Semaglutide,0.25 or 0.'5MG'$ /DOS, (OZEMPIC, 0.25 OR 0.5 MG/DOSE,) 2 MG/3ML SOPN Inject 0.25 mg into the skin once a week. 3 mL 2   VITAMIN D PO Take by mouth. Otc one daily     No current facility-administered medications for this visit.    Review of Systems: GENERAL: negative for malaise, night sweats HEENT: No changes in hearing or vision, no nose bleeds or other nasal problems. NECK: Negative for lumps, goiter, pain and significant neck swelling RESPIRATORY: Negative for cough, wheezing CARDIOVASCULAR: Negative for chest pain, leg swelling, palpitations, orthopnea  GI: SEE HPI MUSCULOSKELETAL: Negative for joint pain or swelling, back pain, and muscle pain. SKIN: Negative for lesions, rash PSYCH: Negative for sleep disturbance, mood disorder and recent psychosocial stressors. HEMATOLOGY Negative for prolonged bleeding, bruising easily, and swollen nodes. ENDOCRINE: Negative for cold or heat intolerance, polyuria, polydipsia and goiter. NEURO:  negative for tremor, gait imbalance, syncope and seizures. The remainder of the review of systems is noncontributory.   Physical Exam: BP 124/67 (BP Location: Left Arm, Patient Position: Sitting, Cuff Size: Large)   Pulse (!) 59   Temp 98.2 F (36.8 C) (Oral)   Ht '5\' 6"'$  (1.676 m)   Wt 174 lb (78.9 kg)   BMI 28.08 kg/m  GENERAL: The patient is AO x3, in no acute distress. HEENT: Head is normocephalic and atraumatic. EOMI are intact. Mouth is well hydrated and without lesions. NECK: Supple. No masses LUNGS: Clear to auscultation. No presence of rhonchi/wheezing/rales. Adequate chest expansion HEART: RRR, normal s1 and s2. ABDOMEN: Soft, nontender, no guarding, no peritoneal signs, and nondistended. BS +. No masses. EXTREMITIES: Without any cyanosis, clubbing, rash, lesions or edema. NEUROLOGIC: AOx3, no focal motor deficit. SKIN: no jaundice, no rashes   Imaging/Labs: as above  I personally reviewed and interpreted the available labs, imaging and endoscopic files.  Impression and Plan: [name] is a 66 y.o. female with no significant past medical history who presents for evaluation of positive Cologuard. Patient does not have any high risk factors for colorectal cancer malignancy.  She has been asymptomatic. Discussed cologuard test results in detail, specifically what it means when the test is positive or negative.  Discussed that there is a possibility that even when the test is positive there may not be a polyp found on colonoscopy. More than 50% of the office visit was dedicated to discussing the procedure, including the day of and risks involved. Patient understands what the procedure involves including the benefits and any risks. Patient understands alternatives to the proposed procedure. Risks including (but not limited to) bleeding, tearing of the lining (perforation), rupture of adjacent organs, problems with heart and lung function, infection, and medication reactions. A  small percentage of complications may require surgery, hospitalization, repeat endoscopic procedure, and/or transfusion. A small percentage of polyps and other tumors may not be seen.  - Schedule colonoscopy - Hold Ozempic dose a week before procedure  All questions were answered.      Lauren Peppers, MD Gastroenterology and Hepatology [facility]

## 2021-10-28 ENCOUNTER — Ambulatory Visit (HOSPITAL_COMMUNITY)
Admission: RE | Admit: 2021-10-28 | Discharge: 2021-10-28 | Disposition: A | Discharge status: Home / Self Care | Payer: 59 | Attending: Gastroenterology | Admitting: Gastroenterology

## 2021-10-28 ENCOUNTER — Other Ambulatory Visit: Payer: Self-pay

## 2021-10-28 ENCOUNTER — Encounter (INDEPENDENT_AMBULATORY_CARE_PROVIDER_SITE_OTHER): Payer: Self-pay

## 2021-10-28 ENCOUNTER — Ambulatory Visit (HOSPITAL_COMMUNITY): Payer: 59 | Admitting: Anesthesiology

## 2021-10-28 ENCOUNTER — Ambulatory Visit (HOSPITAL_BASED_OUTPATIENT_CLINIC_OR_DEPARTMENT_OTHER): Payer: 59 | Admitting: Anesthesiology

## 2021-10-28 ENCOUNTER — Other Ambulatory Visit (INDEPENDENT_AMBULATORY_CARE_PROVIDER_SITE_OTHER): Payer: Self-pay

## 2021-10-28 ENCOUNTER — Telehealth (INDEPENDENT_AMBULATORY_CARE_PROVIDER_SITE_OTHER): Payer: Self-pay

## 2021-10-28 ENCOUNTER — Encounter (HOSPITAL_COMMUNITY)
Admission: RE | Disposition: A | Discharge status: Home / Self Care | Payer: Self-pay | Source: Home / Self Care | Attending: Gastroenterology

## 2021-10-28 ENCOUNTER — Encounter (HOSPITAL_COMMUNITY): Payer: Self-pay | Admitting: Gastroenterology

## 2021-10-28 DIAGNOSIS — Z1211 Encounter for screening for malignant neoplasm of colon: Secondary | ICD-10-CM | POA: Diagnosis not present

## 2021-10-28 DIAGNOSIS — G709 Myoneural disorder, unspecified: Secondary | ICD-10-CM | POA: Insufficient documentation

## 2021-10-28 DIAGNOSIS — R195 Other fecal abnormalities: Secondary | ICD-10-CM

## 2021-10-28 DIAGNOSIS — K5730 Diverticulosis of large intestine without perforation or abscess without bleeding: Secondary | ICD-10-CM

## 2021-10-28 DIAGNOSIS — K635 Polyp of colon: Secondary | ICD-10-CM | POA: Diagnosis not present

## 2021-10-28 HISTORY — PX: COLONOSCOPY WITH PROPOFOL: SHX5780

## 2021-10-28 SURGERY — COLONOSCOPY WITH PROPOFOL
Anesthesia: General

## 2021-10-28 MED ORDER — PROPOFOL 500 MG/50ML IV EMUL
INTRAVENOUS | Status: DC | PRN
  Administered 2021-10-28: 200 ug/kg/min via INTRAVENOUS

## 2021-10-28 MED ORDER — LACTATED RINGERS IV SOLN: INTRAVENOUS | Status: DC

## 2021-10-28 MED ORDER — PROPOFOL 10 MG/ML IV BOLUS
INTRAVENOUS | Status: DC | PRN
  Administered 2021-10-28: 30 mg via INTRAVENOUS
  Administered 2021-10-28: 80 mg via INTRAVENOUS

## 2021-10-28 MED ORDER — PEG 3350-KCL-NA BICARB-NACL 420 G PO SOLR: 4000.0000 mL | ORAL | 0 refills | Status: DC

## 2021-10-28 MED ORDER — LIDOCAINE HCL (CARDIAC) PF 100 MG/5ML IV SOSY
PREFILLED_SYRINGE | INTRAVENOUS | Status: DC | PRN
  Administered 2021-10-28: 50 mg via INTRATRACHEAL

## 2021-10-28 NOTE — OR Nursing (Signed)
Lauren Hansen had a procedure at [facility] on 10/28/21 and cannot return to work until noon on 10/29/21.

## 2021-10-28 NOTE — Op Note (Signed)
[facility] Patient Name: Lauren Hansen Procedure Date: 10/28/2021 10:56 AM MRN: [id number] Date of Birth: November 28, 1955 Attending MD: [name] ,  CSN: [id number] Age: 66 Admit Type: Outpatient Procedure:                Colonoscopy Indications:              Positive Cologuard test Providers:                [name], Hughie Closs RN, RN, Everardo Pacific Referring MD:              Medicines:                Monitored Anesthesia Care Complications:            No immediate complications. Estimated Blood Loss:     Estimated blood loss: none. Procedure:                Pre-Anesthesia Assessment:                           - Prior to the procedure, a History and Physical                            was performed, and patient medications, allergies                            and sensitivities were reviewed. The patient's                            tolerance of previous anesthesia was reviewed.                           - The risks and benefits of the procedure and the                            sedation options and risks were discussed with the                            patient. All questions were answered and informed                            consent was obtained.                           - ASA Grade Assessment: II - A patient with mild                            systemic disease.                           After obtaining informed consent, the colonoscope                            was passed under direct vision. Throughout the  procedure, the patient's blood pressure, pulse, and                            oxygen saturations were monitored continuously. The                            PCF-HQ190L ([id number]) scope was introduced through                            the anus and advanced to the the cecum, identified                            by appendiceal orifice and ileocecal valve. The                            colonoscopy  was performed with difficulty due to                            inadequate bowel prep. The patient tolerated the                            procedure well. The quality of the bowel                            preparation was poor in the R side of the colon. Scope In: 11:08:19 AM Scope Out: 11:18:45 AM Total Procedure Duration: 0 hours 10 minutes 26 seconds  Findings:      The perianal and digital rectal examinations were normal.      Extensive amounts of semi-solid stool was found in the transverse colon,       at the hepatic flexure, in the ascending colon and in the cecum,       precluding visualization. I attempted to do a thorough evaluation of the       colon as thorough as possible but given bowel prep there are areas that       could have small polyps or lesions. No large masses or overt bleeding       was seen.      The retroflexed view of the distal rectum and anal verge was normal and       showed no anal or rectal abnormalities. Impression:               - Preparation of the colon was poor.                           - Stool in the transverse colon, at the hepatic                            flexure, in the ascending colon and in the cecum.                           - The distal rectum and anal verge are normal on                            retroflexion view.                           -  No specimens collected. Moderate Sedation:      Per Anesthesia Care Recommendation:           - Discharge patient to home (ambulatory).                           - Clear liquid diet today.                           - Repeat colonoscopy at the next available                            appointment because the bowel preparation was poor                            - wil,discuss with patient if possible to proceed                            tomorrow. Procedure Code(s):        --- Professional ---                           [phone], Colonoscopy, flexible; diagnostic, including                             collection of specimen(s) by brushing or washing,                            when performed (separate procedure) Diagnosis Code(s):        --- Professional ---                           R19.5, Other fecal abnormalities CPT copyright 2019 American Medical Association. All rights reserved. The codes documented in this report are preliminary and upon coder review may  be revised to meet current compliance requirements. [name], MD [name],  10/28/2021 11:24:00 AM This report has been signed electronically. Number of Addenda: 0

## 2021-10-28 NOTE — Interval H&P Note (Signed)
History and Physical Interval Note:  10/28/2021 9:29 AM  Lauren Hansen  has presented today for surgery, with the diagnosis of Screening Colonoscopy Positive cologuard.  The various methods of treatment have been discussed with the patient and family. After consideration of risks, benefits and other options for treatment, the patient has consented to  Procedure(s) with comments: COLONOSCOPY WITH PROPOFOL (N/A) - 1045 ASA 2 as a surgical intervention.  The patient's history has been reviewed, patient examined, no change in status, stable for surgery.  I have reviewed the patient's chart and labs.  Questions were answered to the patient's satisfaction.     [name]

## 2021-10-28 NOTE — Anesthesia Preprocedure Evaluation (Addendum)
Anesthesia Evaluation  Patient identified by MRN, date of birth, ID band Patient awake    Reviewed: Allergy & Precautions, NPO status , Patient's Chart, lab work & pertinent test results  Airway Mallampati: III  TM Distance: >3 FB Neck ROM: Full    Dental  (+) Dental Advisory Given, Caps   Pulmonary neg pulmonary ROS,    Pulmonary exam normal breath sounds clear to auscultation       Cardiovascular negative cardio ROS Normal cardiovascular exam Rhythm:Regular Rate:Normal     Neuro/Psych  Neuromuscular disease negative psych ROS   GI/Hepatic negative GI ROS, Neg liver ROS,   Endo/Other  negative endocrine ROS  Renal/GU negative Renal ROS  negative genitourinary   Musculoskeletal negative musculoskeletal ROS (+)   Abdominal   Peds negative pediatric ROS (+)  Hematology negative hematology ROS (+)   Anesthesia Other Findings Snoring   Reproductive/Obstetrics negative OB ROS                            Anesthesia Physical Anesthesia Plan  ASA: 2  Anesthesia Plan: General   Post-op Pain Management: Minimal or no pain anticipated   Induction: Intravenous  PONV Risk Score and Plan: Propofol infusion  Airway Management Planned: Nasal Cannula and Natural Airway  Additional Equipment:   Intra-op Plan:   Post-operative Plan:   Informed Consent: I have reviewed the patients History and Physical, chart, labs and discussed the procedure including the risks, benefits and alternatives for the proposed anesthesia with the patient or authorized representative who has indicated his/her understanding and acceptance.     Dental advisory given  Plan Discussed with: CRNA and Surgeon  Anesthesia Plan Comments:        Anesthesia Quick Evaluation

## 2021-10-28 NOTE — Transfer of Care (Signed)
Immediate Anesthesia Transfer of Care Note  Patient: Lauren Hansen  Procedure(s) Performed: COLONOSCOPY WITH PROPOFOL  Patient Location: Endoscopy Unit  Anesthesia Type:General  Level of Consciousness: sedated, patient cooperative and responds to stimulation  Airway & Oxygen Therapy: Patient Spontanous Breathing  Post-op Assessment: Report given to RN, Post -op Vital signs reviewed and stable and Patient moving all extremities  Post vital signs: Reviewed and stable  Last Vitals:  Vitals Value Taken Time  BP    Temp    Pulse    Resp    SpO2      Last Pain:  Vitals:   10/28/21 1106  TempSrc:   PainSc: 0-No pain      Patients Stated Pain Goal: 3 (97/58/83 2549)  Complications: No notable events documented.

## 2021-10-28 NOTE — Discharge Instructions (Signed)
You are being discharged to home.  Take a clear liquid diet today.  Your physician has recommended a repeat colonoscopy at the next available appointment because the bowel preparation was poor.

## 2021-10-28 NOTE — Telephone Encounter (Signed)
Elisa Sorlie Ann Kalil Woessner, CMA  ?

## 2021-10-28 NOTE — Anesthesia Postprocedure Evaluation (Signed)
Anesthesia Post Note  Patient: Lauren Hansen  Procedure(s) Performed: COLONOSCOPY WITH PROPOFOL  Patient location during evaluation: Phase II Anesthesia Type: General Level of consciousness: awake and alert and oriented Pain management: pain level controlled Vital Signs Assessment: post-procedure vital signs reviewed and stable Respiratory status: spontaneous breathing, nonlabored ventilation and respiratory function stable Cardiovascular status: blood pressure returned to baseline and stable Postop Assessment: no apparent nausea or vomiting Anesthetic complications: no   No notable events documented.   Last Vitals:  Vitals:   10/28/21 0916 10/28/21 1122  BP: (!) 107/43 (!) 93/42  Pulse: 79 (!) 56  Resp: 20 15  Temp: 36.6 C 36.7 C  SpO2: 93% 95%    Last Pain:  Vitals:   10/28/21 1122  TempSrc: Axillary  PainSc: 0-No pain                 Donisha Hoch C [place]

## 2021-10-29 ENCOUNTER — Encounter (INDEPENDENT_AMBULATORY_CARE_PROVIDER_SITE_OTHER): Payer: Self-pay | Admitting: *Deleted

## 2021-10-29 ENCOUNTER — Ambulatory Visit (HOSPITAL_COMMUNITY)
Admission: RE | Admit: 2021-10-29 | Discharge: 2021-10-29 | Disposition: A | Discharge status: Home / Self Care | Payer: 59 | Attending: Gastroenterology | Admitting: Gastroenterology

## 2021-10-29 ENCOUNTER — Ambulatory Visit (HOSPITAL_COMMUNITY): Payer: 59 | Admitting: Anesthesiology

## 2021-10-29 ENCOUNTER — Encounter (HOSPITAL_COMMUNITY)
Admission: RE | Disposition: A | Discharge status: Home / Self Care | Payer: Self-pay | Source: Home / Self Care | Attending: Gastroenterology

## 2021-10-29 ENCOUNTER — Ambulatory Visit (HOSPITAL_BASED_OUTPATIENT_CLINIC_OR_DEPARTMENT_OTHER): Payer: 59 | Admitting: Anesthesiology

## 2021-10-29 DIAGNOSIS — K5730 Diverticulosis of large intestine without perforation or abscess without bleeding: Secondary | ICD-10-CM | POA: Diagnosis not present

## 2021-10-29 DIAGNOSIS — D122 Benign neoplasm of ascending colon: Secondary | ICD-10-CM | POA: Insufficient documentation

## 2021-10-29 DIAGNOSIS — D120 Benign neoplasm of cecum: Secondary | ICD-10-CM | POA: Diagnosis not present

## 2021-10-29 DIAGNOSIS — R195 Other fecal abnormalities: Secondary | ICD-10-CM | POA: Diagnosis not present

## 2021-10-29 DIAGNOSIS — D123 Benign neoplasm of transverse colon: Secondary | ICD-10-CM | POA: Insufficient documentation

## 2021-10-29 DIAGNOSIS — K635 Polyp of colon: Secondary | ICD-10-CM | POA: Diagnosis not present

## 2021-10-29 DIAGNOSIS — Z1211 Encounter for screening for malignant neoplasm of colon: Secondary | ICD-10-CM | POA: Insufficient documentation

## 2021-10-29 DIAGNOSIS — R0683 Snoring: Secondary | ICD-10-CM | POA: Diagnosis not present

## 2021-10-29 HISTORY — PX: POLYPECTOMY: SHX5525

## 2021-10-29 HISTORY — PX: COLONOSCOPY WITH PROPOFOL: SHX5780

## 2021-10-29 LAB — HM COLONOSCOPY

## 2021-10-29 SURGERY — COLONOSCOPY WITH PROPOFOL
Anesthesia: General

## 2021-10-29 MED ORDER — PROPOFOL 500 MG/50ML IV EMUL
INTRAVENOUS | Status: DC | PRN
  Administered 2021-10-29: 100 ug/kg/min via INTRAVENOUS

## 2021-10-29 MED ORDER — LACTATED RINGERS IV SOLN: INTRAVENOUS | Status: DC | PRN

## 2021-10-29 MED ORDER — PROPOFOL 10 MG/ML IV BOLUS
INTRAVENOUS | Status: DC | PRN
  Administered 2021-10-29: 150 mg via INTRAVENOUS

## 2021-10-29 NOTE — Anesthesia Preprocedure Evaluation (Signed)
Anesthesia Evaluation  Patient identified by MRN, date of birth, ID band Patient awake    Reviewed: Allergy & Precautions, NPO status , Patient's Chart, lab work & pertinent test results  Airway Mallampati: III  TM Distance: >3 FB Neck ROM: Full    Dental  (+) Dental Advisory Given, Caps   Pulmonary neg pulmonary ROS,    Pulmonary exam normal breath sounds clear to auscultation       Cardiovascular negative cardio ROS Normal cardiovascular exam Rhythm:Regular Rate:Normal     Neuro/Psych  Neuromuscular disease negative psych ROS   GI/Hepatic negative GI ROS, Neg liver ROS,   Endo/Other  negative endocrine ROS  Renal/GU negative Renal ROS  negative genitourinary   Musculoskeletal negative musculoskeletal ROS (+)   Abdominal   Peds negative pediatric ROS (+)  Hematology negative hematology ROS (+)   Anesthesia Other Findings Snoring   Reproductive/Obstetrics negative OB ROS                             Anesthesia Physical  Anesthesia Plan  ASA: 2  Anesthesia Plan: General   Post-op Pain Management: Minimal or no pain anticipated   Induction: Intravenous  PONV Risk Score and Plan: Propofol infusion  Airway Management Planned: Nasal Cannula and Natural Airway  Additional Equipment:   Intra-op Plan:   Post-operative Plan:   Informed Consent: I have reviewed the patients History and Physical, chart, labs and discussed the procedure including the risks, benefits and alternatives for the proposed anesthesia with the patient or authorized representative who has indicated his/her understanding and acceptance.     Dental advisory given  Plan Discussed with: CRNA and Surgeon  Anesthesia Plan Comments:         Anesthesia Quick Evaluation

## 2021-10-29 NOTE — Anesthesia Postprocedure Evaluation (Signed)
Anesthesia Post Note  Patient: Lauren Hansen  Procedure(s) Performed: COLONOSCOPY WITH PROPOFOL POLYPECTOMY  Patient location during evaluation: Phase II Anesthesia Type: General Level of consciousness: awake and alert and oriented Pain management: pain level controlled Vital Signs Assessment: post-procedure vital signs reviewed and stable Respiratory status: spontaneous breathing, nonlabored ventilation and respiratory function stable Cardiovascular status: blood pressure returned to baseline and stable Postop Assessment: no apparent nausea or vomiting Anesthetic complications: no   No notable events documented.   Last Vitals:  Vitals:   10/29/21 0949 10/29/21 1113  BP: 126/71 102/71  Pulse: (!) 56 60  Resp: 14 14  Temp: 36.9 C 36.5 C  SpO2: 96% 97%    Last Pain:  Vitals:   10/29/21 1113  TempSrc: Oral  PainSc: 0-No pain                 Cece Milhouse C Khyra Viscuso

## 2021-10-29 NOTE — Discharge Instructions (Signed)
You are being discharged to home.  Resume your previous diet.  We are waiting for your pathology results.  Your physician has recommended a repeat colonoscopy (date to be determined after pending pathology results are reviewed) for surveillance.  

## 2021-10-29 NOTE — Interval H&P Note (Signed)
History and Physical Interval Note:  10/29/2021 10:02 AM  Lauren Hansen  has presented today for surgery, with the diagnosis of Screening Colonoscopy positive cologuard.  The various methods of treatment have been discussed with the patient and family. After consideration of risks, benefits and other options for treatment, the patient has consented to  Procedure(s) with comments: COLONOSCOPY WITH PROPOFOL (N/A) - 1115 ASA 2 as a surgical intervention.  The patient's history has been reviewed, patient examined, no change in status, stable for surgery.  I have reviewed the patient's chart and labs.  Questions were answered to the patient's satisfaction.     [name]

## 2021-10-29 NOTE — Transfer of Care (Signed)
Immediate Anesthesia Transfer of Care Note  Patient: Lauren Hansen  Procedure(s) Performed: COLONOSCOPY WITH PROPOFOL POLYPECTOMY  Patient Location: Short Stay  Anesthesia Type:General  Level of Consciousness: awake and patient cooperative  Airway & Oxygen Therapy: Patient Spontanous Breathing  Post-op Assessment: Report given to RN and Post -op Vital signs reviewed and stable  Post vital signs: Reviewed and stable  Last Vitals:  Vitals Value Taken Time  BP 102/71 10/29/21 1113  Temp 36.5 C 10/29/21 1113  Pulse 60 10/29/21 1113  Resp 14 10/29/21 1113  SpO2 97 % 10/29/21 1113    Last Pain:  Vitals:   10/29/21 1113  TempSrc: Oral  PainSc: 0-No pain      Patients Stated Pain Goal: 3 (91/63/84 6659)  Complications: No notable events documented.

## 2021-10-29 NOTE — Anesthesia Procedure Notes (Signed)
Date/Time: 10/29/2021 10:39 AM  Performed by: Vista Deck, CRNAPre-anesthesia Checklist: Patient identified, Emergency Drugs available, Suction available, Timeout performed and Patient being monitored Patient Re-evaluated:Patient Re-evaluated prior to induction Oxygen Delivery Method: Nasal Cannula

## 2021-10-29 NOTE — Op Note (Signed)
[facility] Patient Name: Lauren Hansen Procedure Date: 10/29/2021 10:36 AM MRN: [id number] Date of Birth: April 29, 1955 Attending MD: [name] ,  CSN: [id number] Age: 66 Admit Type: Outpatient Procedure:                Colonoscopy Indications:              Positive Cologuard test Providers:                [name], Lambert Mody, Caprice Kluver,                            Raphael Gibney, Technician Referring MD:              Medicines:                Monitored Anesthesia Care Complications:            No immediate complications. Estimated Blood Loss:     Estimated blood loss: none. Procedure:                Pre-Anesthesia Assessment:                           - Prior to the procedure, a History and Physical                            was performed, and patient medications, allergies                            and sensitivities were reviewed. The patient's                            tolerance of previous anesthesia was reviewed.                           - The risks and benefits of the procedure and the                            sedation options and risks were discussed with the                            patient. All questions were answered and informed                            consent was obtained.                           - ASA Grade Assessment: II - A patient with mild                            systemic disease.                           After obtaining informed consent, the colonoscope                            was passed under direct vision. Throughout the  procedure, the patient's blood pressure, pulse, and                            oxygen saturations were monitored continuously. The                            [id number]-[id number] ([id number]) was introduced through the                            anus and advanced to the the cecum, identified by                            appendiceal orifice and ileocecal valve. The                             colonoscopy was performed without difficulty. The                            patient tolerated the procedure well. The quality                            of the bowel preparation was good. Scope In: 10:44:44 AM Scope Out: 11:07:54 AM Scope Withdrawal Time: 0 hours 17 minutes 54 seconds  Total Procedure Duration: 0 hours 23 minutes 10 seconds  Findings:      The perianal and digital rectal examinations were normal.      Three sessile polyps were found in the ascending colon and cecum. The       polyps were 3 to 4 mm in size. These polyps were removed with a cold       snare. Resection and retrieval were complete.      A 1 mm polyp was found in the ascending colon. The polyp was sessile.       The polyp was removed with a cold biopsy forceps. Resection and       retrieval were complete.      A 4 mm polyp was found in the transverse colon. The polyp was sessile.       The polyp was removed with a cold snare. Resection and retrieval were       complete.      A few small and large-mouthed diverticula were found in the sigmoid       colon and ascending colon.      The retroflexed view of the distal rectum and anal verge was normal and       showed no anal or rectal abnormalities. Impression:               - Three 3 to 4 mm polyps in the ascending colon and                            in the cecum, removed with a cold snare. Resected                            and retrieved.                           - One  1 mm polyp in the ascending colon, removed                            with a cold biopsy forceps. Resected and retrieved.                           - One 4 mm polyp in the transverse colon, removed                            with a cold snare. Resected and retrieved.                           - Diverticulosis in the sigmoid colon and in the                            ascending colon.                           - The distal rectum and anal verge are normal on                             retroflexion view. Moderate Sedation:      Per Anesthesia Care Recommendation:           - Discharge patient to home (ambulatory).                           - Resume previous diet.                           - Await pathology results.                           - Repeat colonoscopy date to be determined after                            pending pathology results are reviewed for                            surveillance. Procedure Code(s):        --- Professional ---                           [phone], Colonoscopy, flexible; with removal of                            tumor(s), polyp(s), or other lesion(s) by snare                            technique                           [id number], 59, Colonoscopy, flexible; with biopsy,                            single or multiple Diagnosis Code(s):        ---  Professional ---                           K63.5, Polyp of colon                           R19.5, Other fecal abnormalities                           K57.30, Diverticulosis of large intestine without                            perforation or abscess without bleeding CPT copyright 2019 [employer]. All rights reserved. The codes documented in this report are preliminary and upon coder review may  be revised to meet current compliance requirements. [name], MD [name],  10/29/2021 11:14:55 AM This report has been signed electronically. Number of Addenda: 0

## 2021-10-30 LAB — SURGICAL PATHOLOGY

## 2021-11-03 ENCOUNTER — Encounter (HOSPITAL_COMMUNITY): Payer: Self-pay | Admitting: Gastroenterology

## 2021-11-27 ENCOUNTER — Other Ambulatory Visit (HOSPITAL_COMMUNITY): Payer: Self-pay

## 2021-12-01 ENCOUNTER — Other Ambulatory Visit (HOSPITAL_COMMUNITY): Payer: Self-pay

## 2021-12-04 ENCOUNTER — Other Ambulatory Visit (HOSPITAL_COMMUNITY): Payer: Self-pay

## 2021-12-08 ENCOUNTER — Other Ambulatory Visit (HOSPITAL_COMMUNITY): Payer: Self-pay

## 2021-12-08 MED ORDER — METHOCARBAMOL 500 MG PO TABS
500.0000 mg | ORAL_TABLET | Freq: Two times a day (BID) | ORAL | 0 refills | Status: AC
  Filled 2021-12-08: qty 90, 45d supply, fill #0

## 2021-12-08 MED ORDER — OZEMPIC (0.25 OR 0.5 MG/DOSE) 2 MG/3ML ~~LOC~~ SOPN
0.2500 mg | PEN_INJECTOR | SUBCUTANEOUS | 2 refills | Status: AC
  Filled 2021-12-08 – 2022-01-27 (×2): qty 3, 56d supply, fill #0
  Filled 2022-03-11 – 2022-03-15 (×2): qty 3, 56d supply, fill #1

## 2021-12-08 MED ORDER — MELOXICAM 7.5 MG PO TABS
7.5000 mg | ORAL_TABLET | Freq: Every day | ORAL | 1 refills | Status: DC
  Filled 2021-12-08 – 2022-07-14 (×4): qty 90, 90d supply, fill #0

## 2021-12-09 ENCOUNTER — Other Ambulatory Visit (HOSPITAL_COMMUNITY): Payer: Self-pay

## 2021-12-22 ENCOUNTER — Other Ambulatory Visit (HOSPITAL_COMMUNITY): Payer: Self-pay

## 2022-01-27 ENCOUNTER — Other Ambulatory Visit: Payer: Self-pay

## 2022-01-27 ENCOUNTER — Other Ambulatory Visit (HOSPITAL_COMMUNITY): Payer: Self-pay

## 2022-02-19 ENCOUNTER — Other Ambulatory Visit (HOSPITAL_COMMUNITY): Payer: Self-pay

## 2022-03-10 DIAGNOSIS — Z139 Encounter for screening, unspecified: Secondary | ICD-10-CM | POA: Diagnosis not present

## 2022-03-10 DIAGNOSIS — E119 Type 2 diabetes mellitus without complications: Secondary | ICD-10-CM | POA: Diagnosis not present

## 2022-03-10 DIAGNOSIS — E559 Vitamin D deficiency, unspecified: Secondary | ICD-10-CM | POA: Diagnosis not present

## 2022-03-10 DIAGNOSIS — E785 Hyperlipidemia, unspecified: Secondary | ICD-10-CM | POA: Diagnosis not present

## 2022-03-11 ENCOUNTER — Other Ambulatory Visit (HOSPITAL_COMMUNITY): Payer: Self-pay

## 2022-03-11 ENCOUNTER — Other Ambulatory Visit: Payer: Self-pay

## 2022-03-15 ENCOUNTER — Other Ambulatory Visit (HOSPITAL_BASED_OUTPATIENT_CLINIC_OR_DEPARTMENT_OTHER): Payer: Self-pay

## 2022-03-17 ENCOUNTER — Other Ambulatory Visit (HOSPITAL_COMMUNITY): Payer: Self-pay

## 2022-03-17 ENCOUNTER — Other Ambulatory Visit: Payer: Self-pay

## 2022-03-17 MED ORDER — OZEMPIC (0.25 OR 0.5 MG/DOSE) 2 MG/3ML ~~LOC~~ SOPN
0.5000 mg | PEN_INJECTOR | SUBCUTANEOUS | 1 refills | Status: DC
  Filled 2022-03-17 (×2): qty 3, 28d supply, fill #0
  Filled 2022-03-17 (×2): qty 9, 84d supply, fill #0
  Filled 2022-04-29: qty 3, 28d supply, fill #1
  Filled 2022-04-29 – 2022-04-30 (×2): qty 3, 28d supply, fill #0
  Filled 2022-05-26: qty 3, 28d supply, fill #1
  Filled 2022-06-28: qty 3, 28d supply, fill #2

## 2022-03-17 MED ORDER — LEVOCETIRIZINE DIHYDROCHLORIDE 5 MG PO TABS
5.0000 mg | ORAL_TABLET | Freq: Every day | ORAL | 2 refills | Status: DC
  Filled 2022-03-17: qty 30, 30d supply, fill #0
  Filled 2022-05-12 – 2022-05-26 (×3): qty 30, 30d supply, fill #1
  Filled 2022-06-28: qty 30, 30d supply, fill #2
  Filled 2022-07-06 – 2022-07-14 (×2): qty 30, 30d supply, fill #0

## 2022-04-09 ENCOUNTER — Other Ambulatory Visit (HOSPITAL_COMMUNITY): Payer: Self-pay

## 2022-04-09 MED ORDER — AMOXICILLIN-POT CLAVULANATE 875-125 MG PO TABS
1.0000 | ORAL_TABLET | Freq: Two times a day (BID) | ORAL | 0 refills | Status: AC
  Filled 2022-04-09: qty 14, 7d supply, fill #0

## 2022-04-14 ENCOUNTER — Other Ambulatory Visit (HOSPITAL_COMMUNITY): Payer: Self-pay

## 2022-04-29 ENCOUNTER — Other Ambulatory Visit (HOSPITAL_COMMUNITY): Payer: Self-pay

## 2022-04-30 ENCOUNTER — Other Ambulatory Visit (HOSPITAL_COMMUNITY): Payer: Self-pay

## 2022-05-08 ENCOUNTER — Other Ambulatory Visit (HOSPITAL_COMMUNITY): Payer: Self-pay

## 2022-05-12 ENCOUNTER — Other Ambulatory Visit (HOSPITAL_COMMUNITY): Payer: Self-pay

## 2022-05-18 ENCOUNTER — Other Ambulatory Visit (HOSPITAL_COMMUNITY): Payer: Self-pay

## 2022-05-19 ENCOUNTER — Other Ambulatory Visit: Payer: Self-pay

## 2022-05-26 ENCOUNTER — Other Ambulatory Visit (HOSPITAL_COMMUNITY): Payer: Self-pay

## 2022-06-21 ENCOUNTER — Other Ambulatory Visit (HOSPITAL_COMMUNITY): Payer: Self-pay

## 2022-06-28 ENCOUNTER — Other Ambulatory Visit (HOSPITAL_COMMUNITY): Payer: Self-pay

## 2022-07-06 ENCOUNTER — Other Ambulatory Visit: Payer: Self-pay

## 2022-07-06 ENCOUNTER — Other Ambulatory Visit (HOSPITAL_COMMUNITY): Payer: Self-pay

## 2022-07-09 ENCOUNTER — Other Ambulatory Visit (HOSPITAL_COMMUNITY): Payer: Self-pay

## 2022-07-13 ENCOUNTER — Other Ambulatory Visit (HOSPITAL_COMMUNITY): Payer: Self-pay

## 2022-07-13 ENCOUNTER — Encounter (HOSPITAL_COMMUNITY): Payer: Self-pay

## 2022-07-13 ENCOUNTER — Other Ambulatory Visit (HOSPITAL_BASED_OUTPATIENT_CLINIC_OR_DEPARTMENT_OTHER): Payer: Self-pay

## 2022-07-14 ENCOUNTER — Other Ambulatory Visit: Payer: Self-pay

## 2022-07-23 IMAGING — DX DG HAND COMPLETE 3+V*R*
3 series · 3 of 3 positions shown · non-contrast
Comparison: None.

CLINICAL DATA: Chronic right hand pain, possible cyst at first
metacarpal

EXAM:
RIGHT HAND - COMPLETE 3+ VIEW

[hand pa]
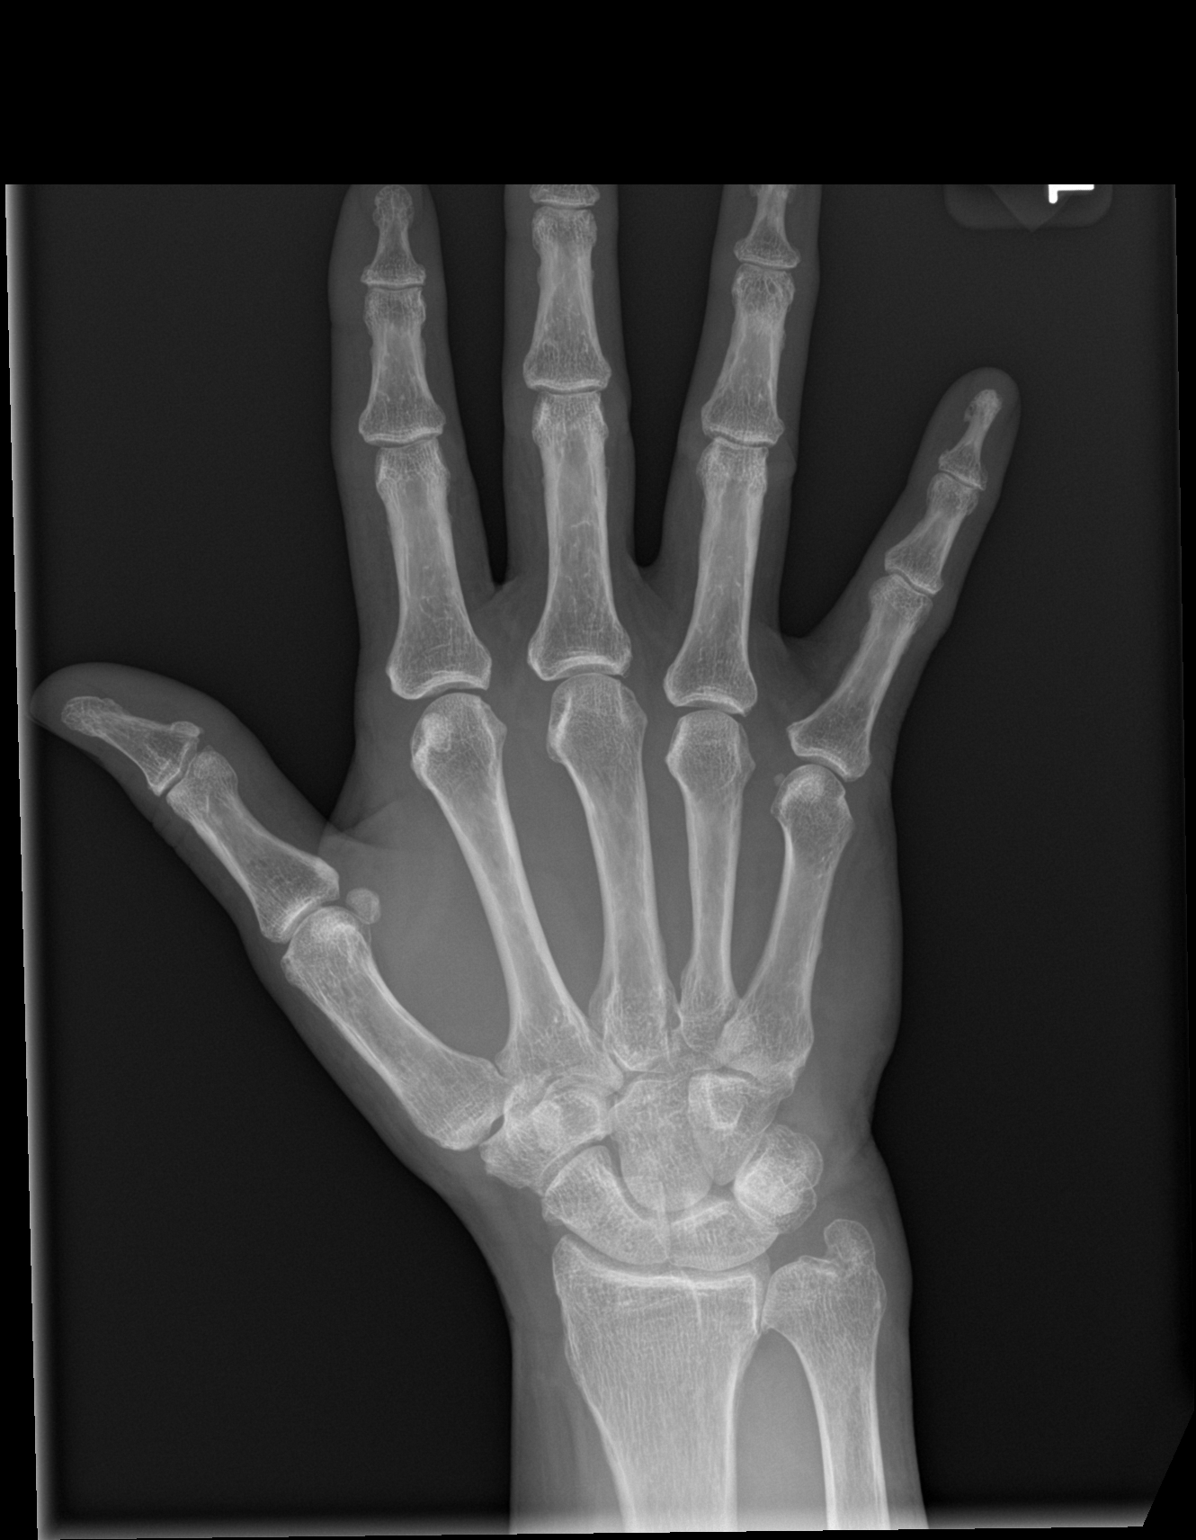

[hand obl]
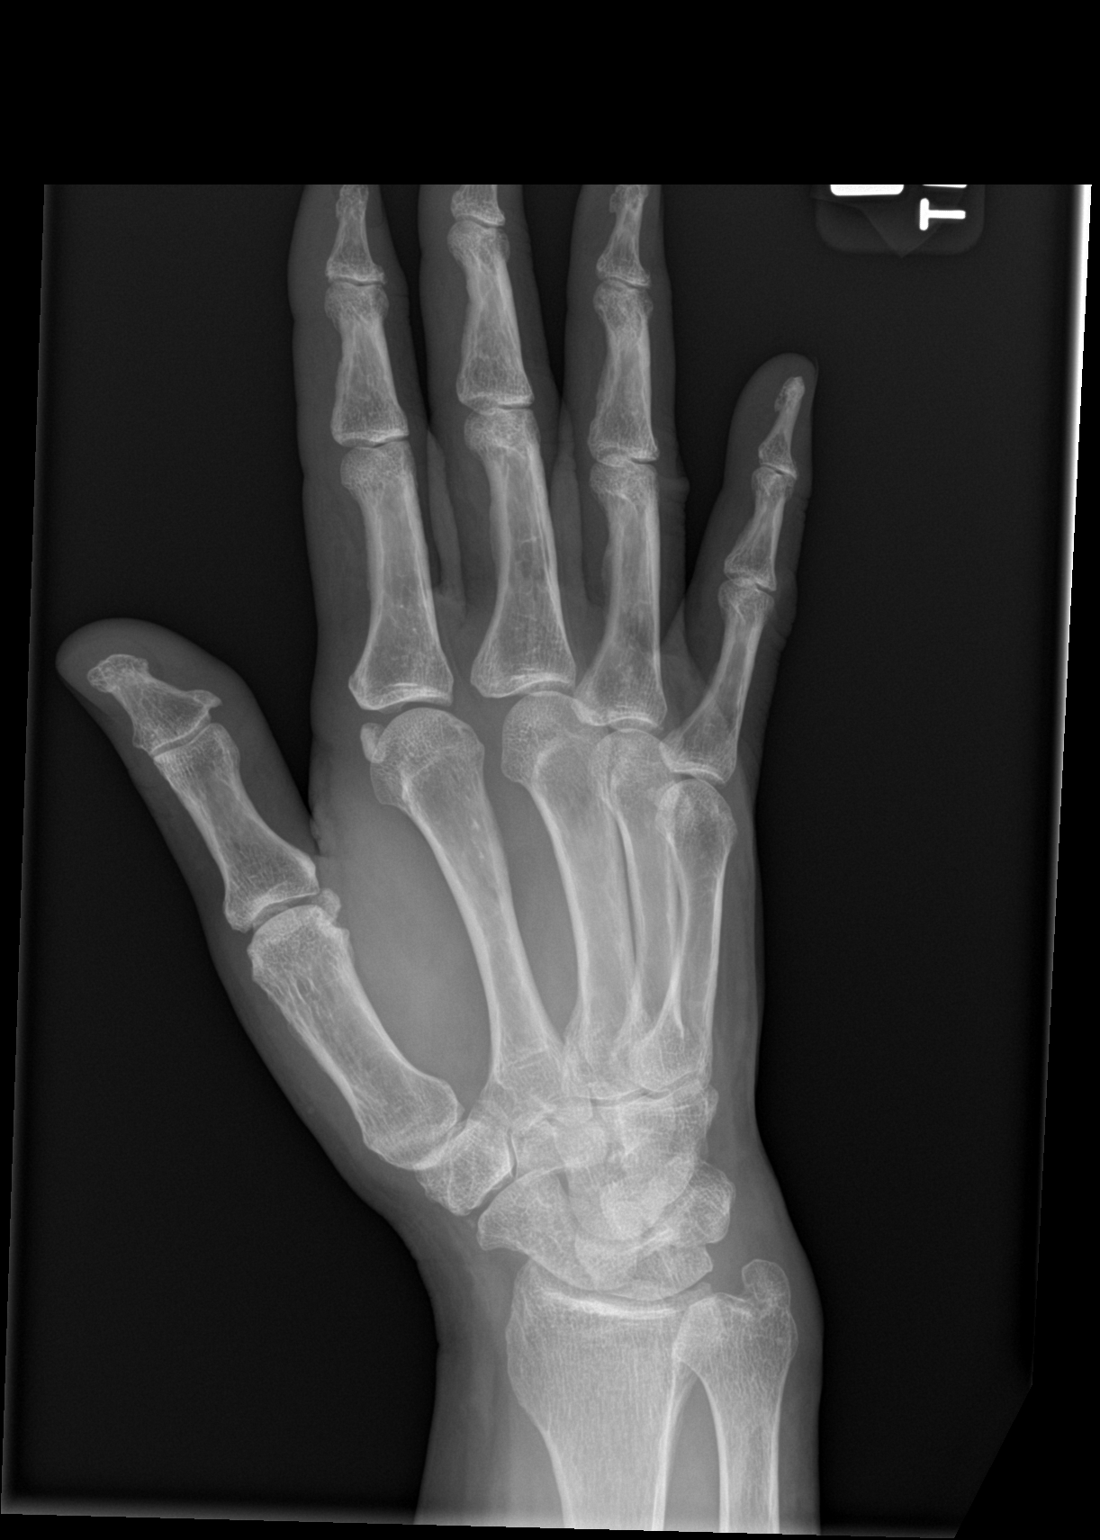

[hand lat]
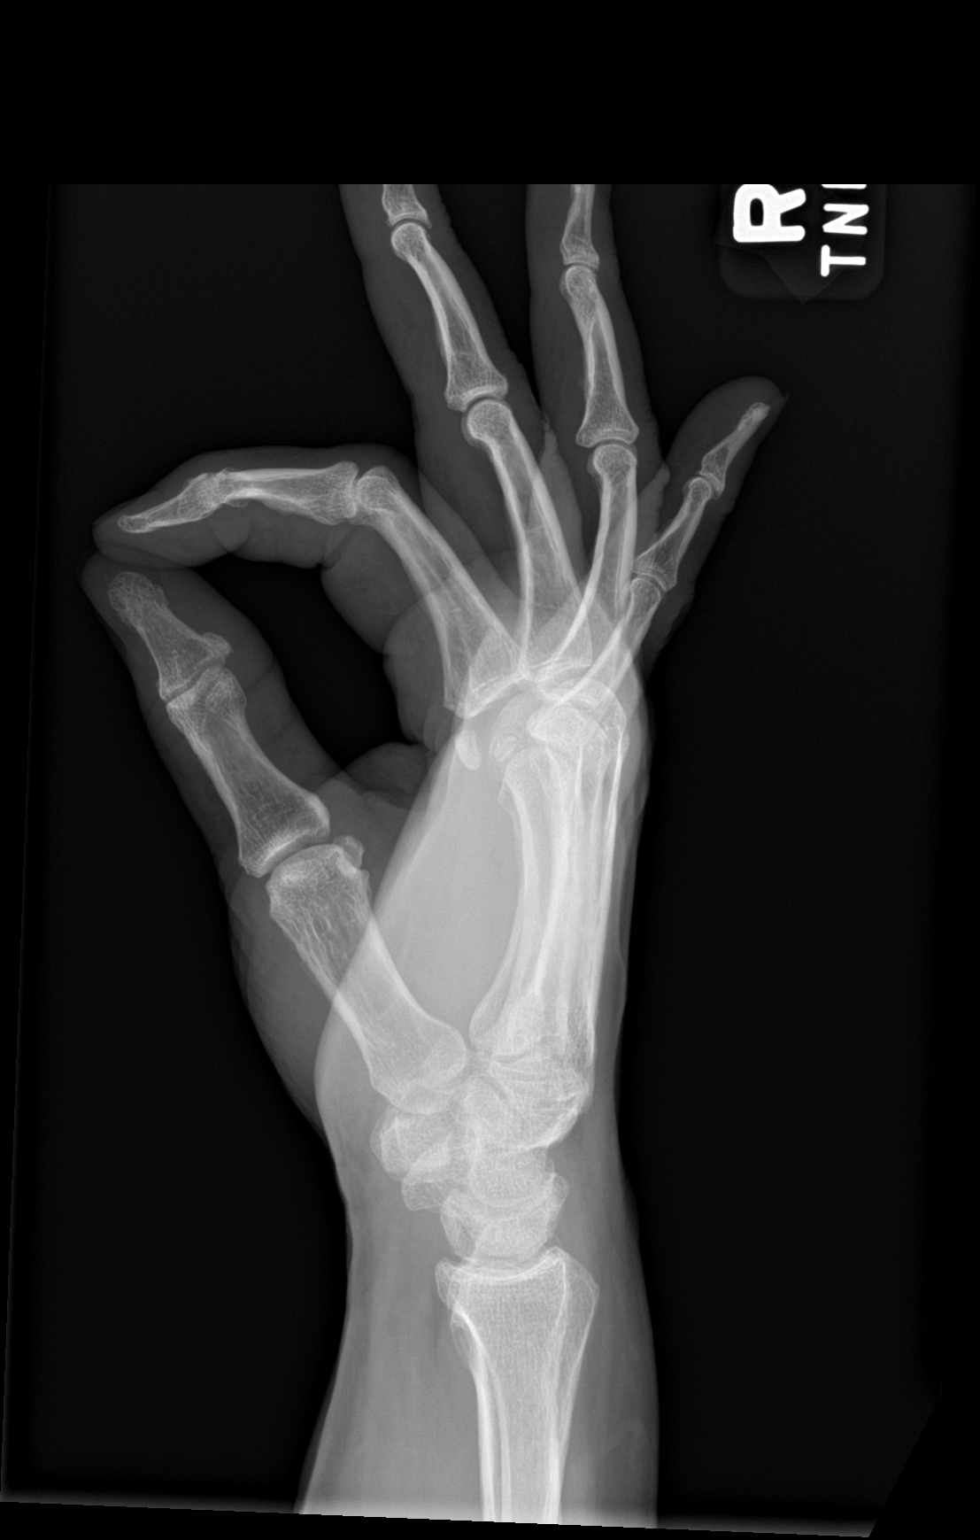

[3 of 3 positions shown; findings below may reference images not displayed]

FINDINGS: Frontal, oblique, and lateral views of the right hand are obtained.
No fracture, subluxation, or dislocation. There is mild diffuse
osteoarthritis most pronounced in the radial aspect of the carpus,
first metacarpophalangeal joint, and throughout the distal
interphalangeal joints. Soft tissues are grossly unremarkable.
IMPRESSION: 1. Diffuse osteoarthritis.
2. No acute or destructive bony lesions.

## 2022-07-23 IMAGING — DX DG KNEE 3 VIEWS*L*
3 series · 3 of 3 positions shown · non-contrast
Comparison: None.

CLINICAL DATA: Medial left knee pain

EXAM:
LEFT KNEE - 3 VIEW

[knee ap]
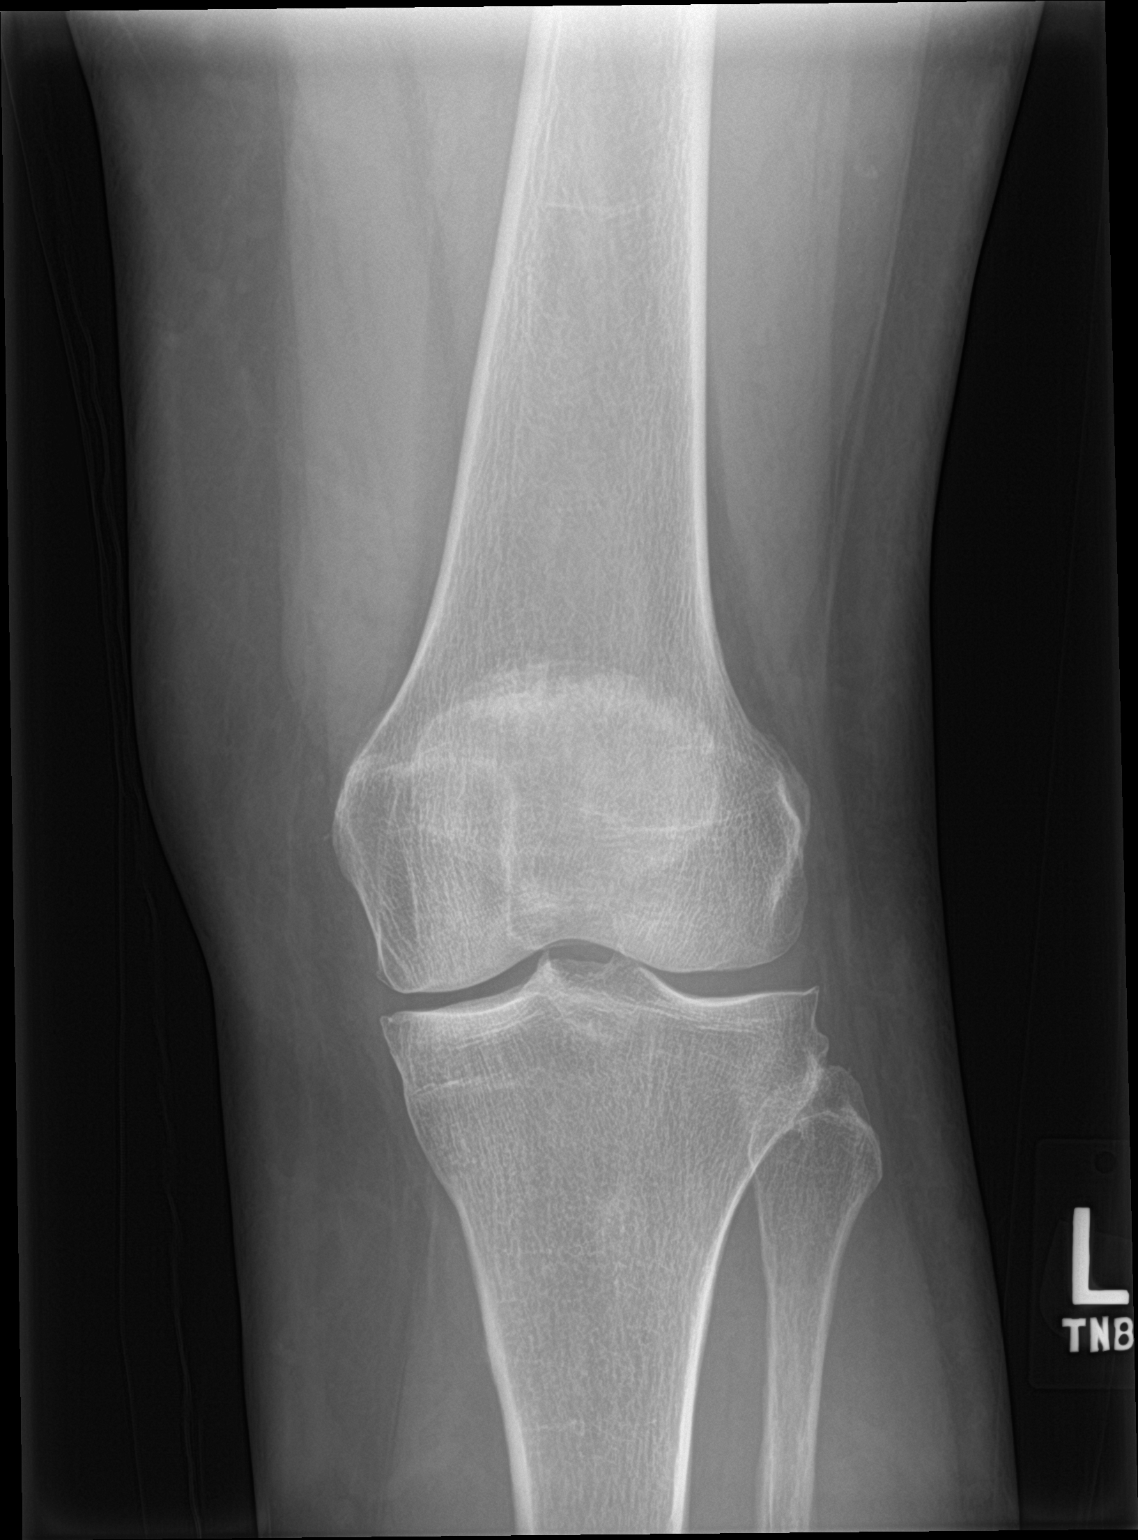

[knee lat]
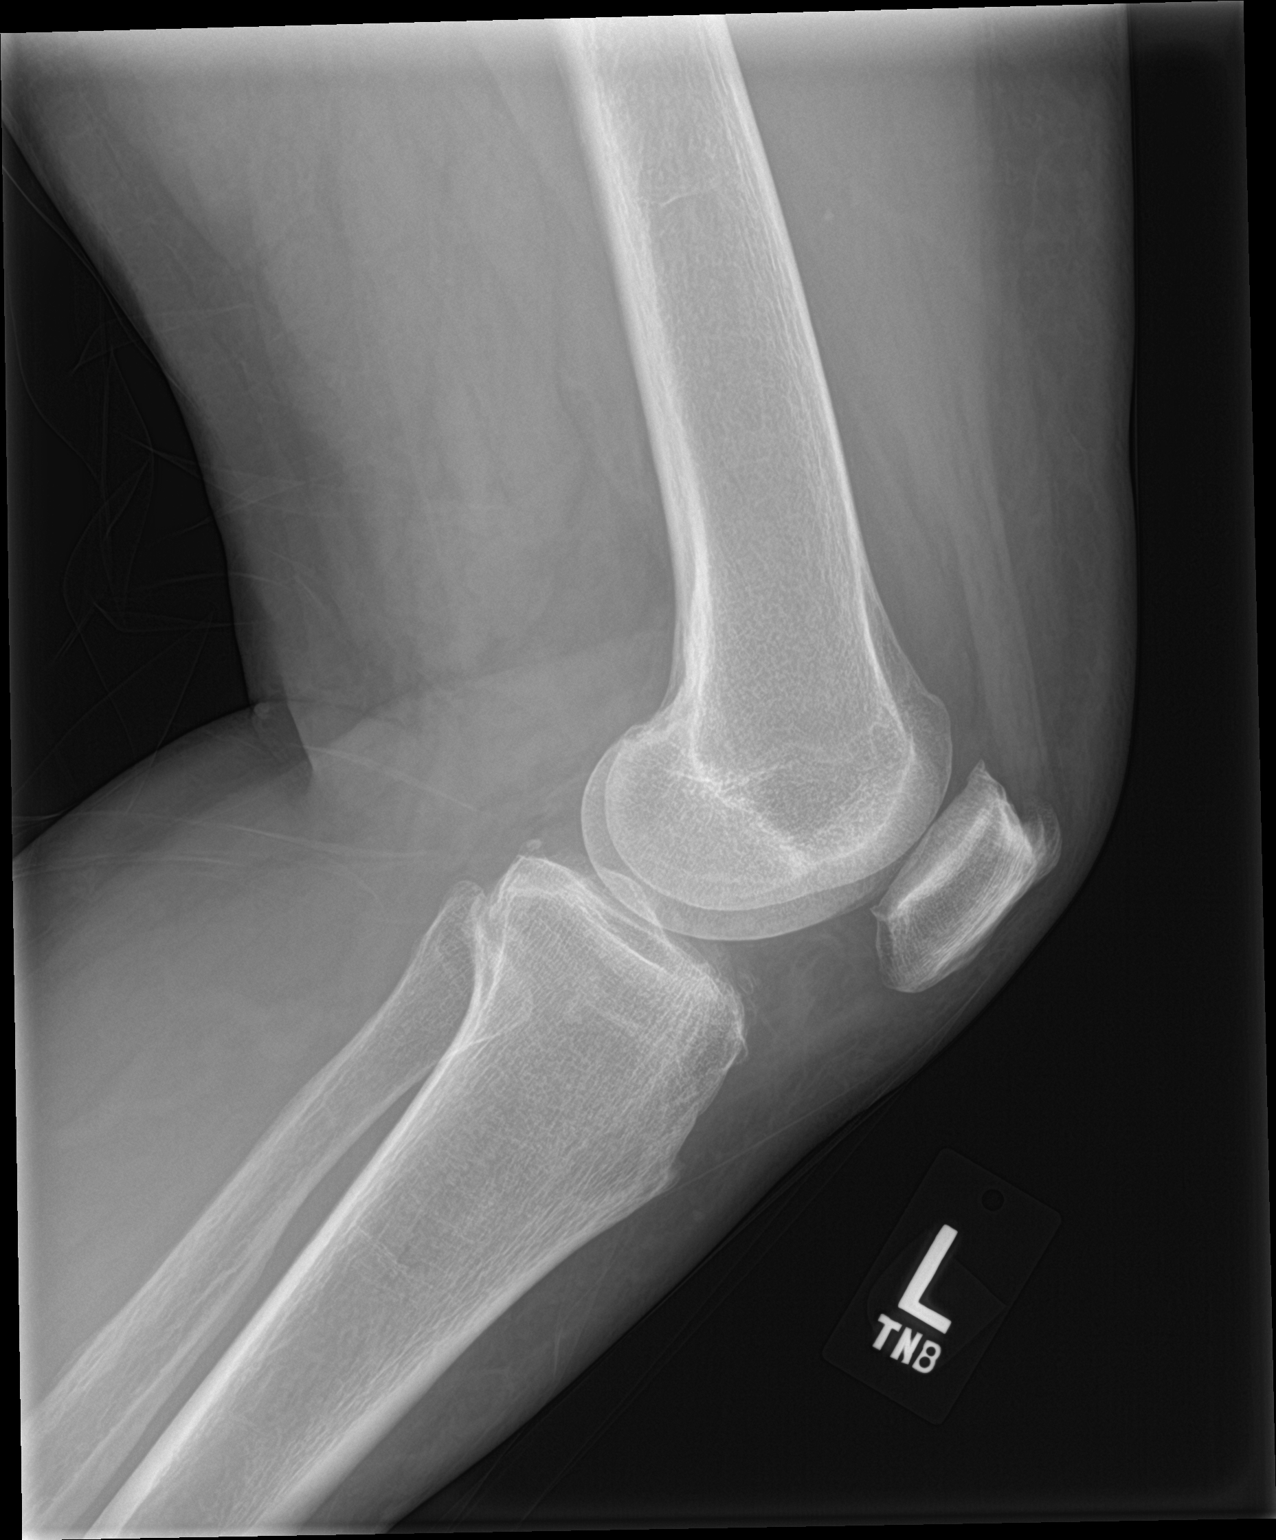

[knee sunrise]
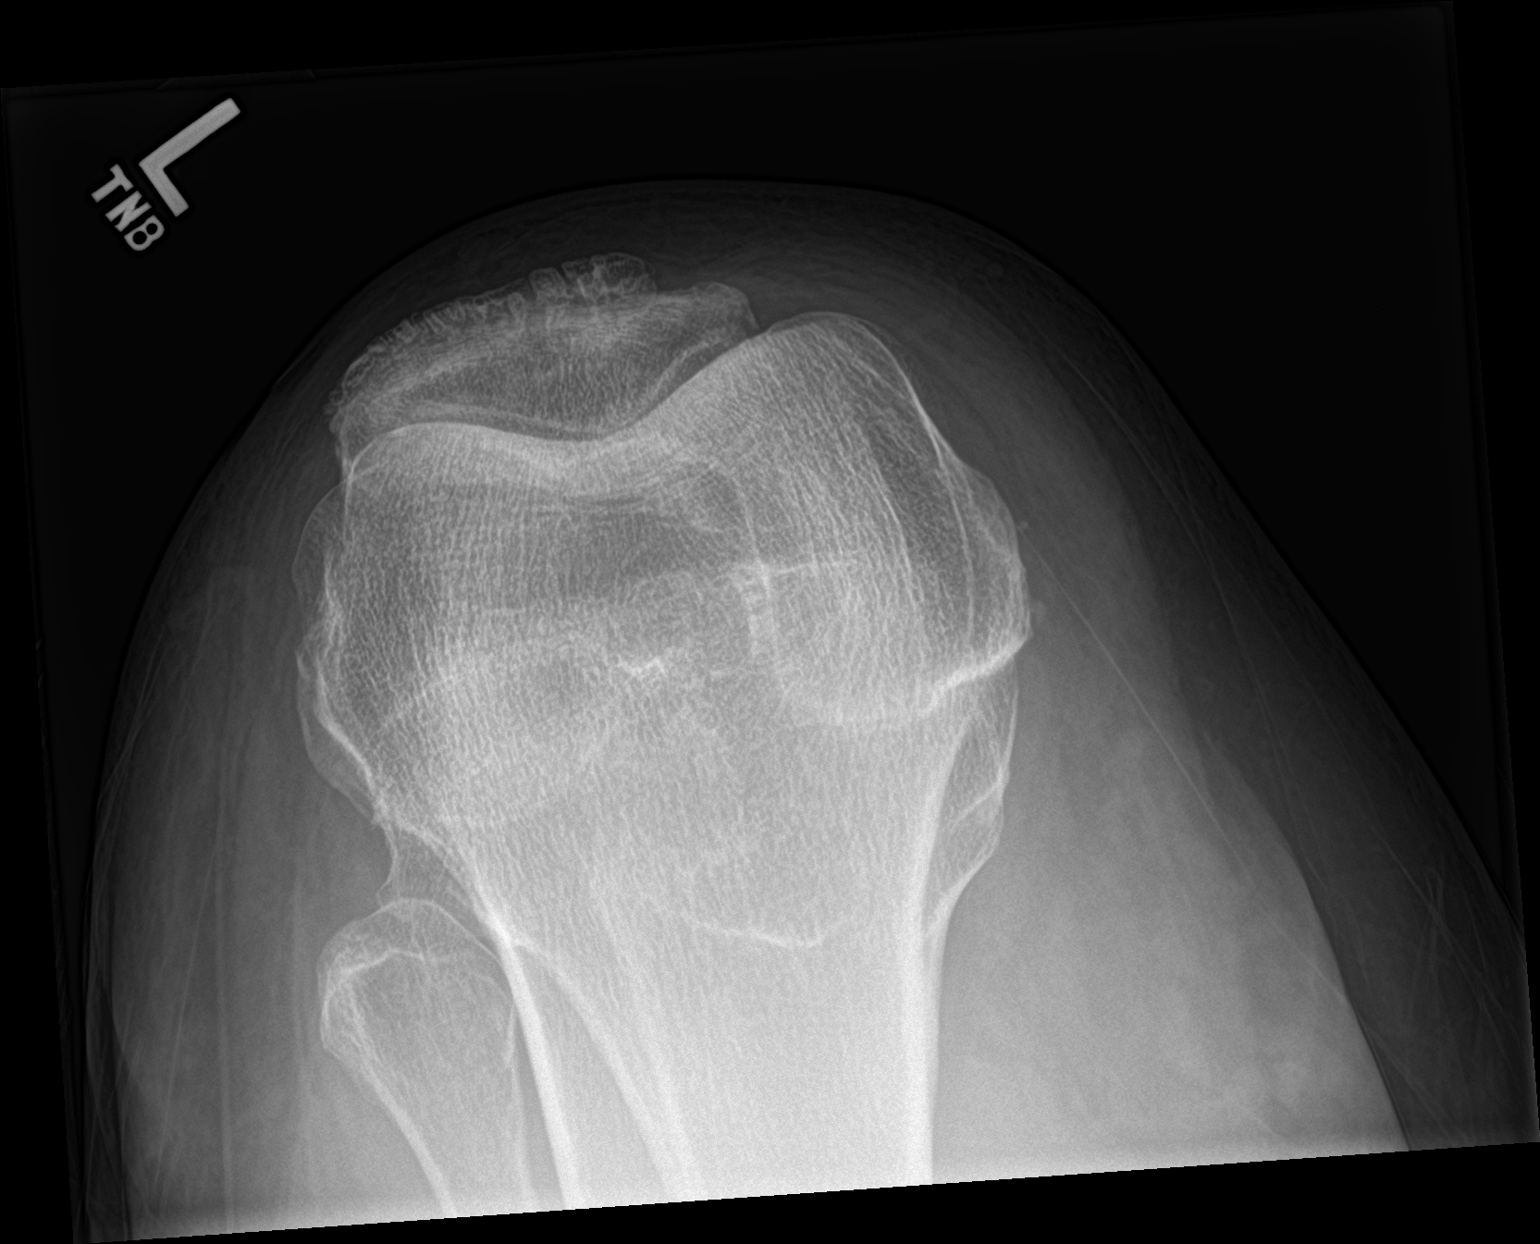

[3 of 3 positions shown; findings below may reference images not displayed]

FINDINGS: Frontal, lateral, and sunrise views of the left knee are obtained.
There is mild medial compartmental joint space narrowing and
osteophyte formation. On the sunrise view, there is significant
patellofemoral compartmental osteoarthritis with joint space
narrowing and osteophyte formation greatest along the lateral
patellar facet. Small joint effusion is likely reactive. No
fracture, subluxation, or dislocation.
IMPRESSION: 1. Left knee osteoarthritis most pronounced in the patellofemoral
compartment.
2. Small joint effusion, likely reactive.
3. No acute bony abnormality.

## 2022-09-08 DIAGNOSIS — E559 Vitamin D deficiency, unspecified: Secondary | ICD-10-CM | POA: Diagnosis not present

## 2022-09-08 DIAGNOSIS — E119 Type 2 diabetes mellitus without complications: Secondary | ICD-10-CM | POA: Diagnosis not present

## 2022-09-08 DIAGNOSIS — E785 Hyperlipidemia, unspecified: Secondary | ICD-10-CM | POA: Diagnosis not present

## 2022-09-15 ENCOUNTER — Other Ambulatory Visit (HOSPITAL_COMMUNITY): Payer: Self-pay | Admitting: Family Medicine

## 2022-09-15 ENCOUNTER — Other Ambulatory Visit (HOSPITAL_COMMUNITY): Payer: Self-pay | Admitting: Internal Medicine

## 2022-09-15 ENCOUNTER — Other Ambulatory Visit (HOSPITAL_COMMUNITY): Payer: Self-pay

## 2022-09-15 DIAGNOSIS — Z13820 Encounter for screening for osteoporosis: Secondary | ICD-10-CM

## 2022-09-15 DIAGNOSIS — Z1231 Encounter for screening mammogram for malignant neoplasm of breast: Secondary | ICD-10-CM

## 2022-09-15 MED ORDER — MELOXICAM 7.5 MG PO TABS
7.5000 mg | ORAL_TABLET | Freq: Every day | ORAL | 1 refills | Status: DC
  Filled 2022-09-15 – 2023-06-11 (×3): qty 90, 90d supply, fill #0

## 2022-09-15 MED ORDER — OZEMPIC (1 MG/DOSE) 4 MG/3ML ~~LOC~~ SOPN
1.0000 mg | PEN_INJECTOR | SUBCUTANEOUS | 3 refills | Status: AC
  Filled 2022-09-15 – 2022-09-20 (×2): qty 3, 28d supply, fill #0
  Filled 2022-11-24 – 2022-12-21 (×2): qty 3, 28d supply, fill #1
  Filled 2023-06-11 (×2): qty 3, 28d supply, fill #2

## 2022-09-15 MED ORDER — KETOCONAZOLE 2 % EX SHAM
MEDICATED_SHAMPOO | CUTANEOUS | 3 refills | Status: AC
  Filled 2022-09-15 – 2022-09-20 (×2): qty 120, 30d supply, fill #0

## 2022-09-15 MED ORDER — LEVOCETIRIZINE DIHYDROCHLORIDE 5 MG PO TABS
5.0000 mg | ORAL_TABLET | Freq: Every day | ORAL | 2 refills | Status: AC
  Filled 2022-09-15 – 2022-09-20 (×2): qty 30, 30d supply, fill #0
  Filled 2023-06-11 (×2): qty 30, 30d supply, fill #1

## 2022-09-20 ENCOUNTER — Other Ambulatory Visit: Payer: Self-pay

## 2022-09-20 ENCOUNTER — Other Ambulatory Visit (HOSPITAL_COMMUNITY): Payer: Self-pay

## 2022-10-06 ENCOUNTER — Ambulatory Visit (HOSPITAL_COMMUNITY)
Admission: RE | Admit: 2022-10-06 | Discharge: 2022-10-06 | Disposition: A | Discharge status: Home / Self Care | Payer: Commercial Managed Care - PPO | Source: Ambulatory Visit | Attending: Family Medicine | Admitting: Family Medicine

## 2022-10-06 ENCOUNTER — Ambulatory Visit (HOSPITAL_COMMUNITY)
Admission: RE | Admit: 2022-10-06 | Discharge: 2022-10-06 | Disposition: A | Discharge status: Home / Self Care | Payer: Commercial Managed Care - PPO | Source: Ambulatory Visit | Attending: Internal Medicine | Admitting: Internal Medicine

## 2022-10-06 DIAGNOSIS — Z13820 Encounter for screening for osteoporosis: Secondary | ICD-10-CM | POA: Diagnosis not present

## 2022-10-06 DIAGNOSIS — Z1231 Encounter for screening mammogram for malignant neoplasm of breast: Secondary | ICD-10-CM | POA: Insufficient documentation

## 2022-11-12 ENCOUNTER — Other Ambulatory Visit (HOSPITAL_COMMUNITY): Payer: Self-pay

## 2022-11-12 ENCOUNTER — Other Ambulatory Visit: Payer: Self-pay

## 2022-11-12 DIAGNOSIS — R112 Nausea with vomiting, unspecified: Secondary | ICD-10-CM | POA: Diagnosis not present

## 2022-11-12 DIAGNOSIS — Z79899 Other long term (current) drug therapy: Secondary | ICD-10-CM | POA: Diagnosis not present

## 2022-11-12 DIAGNOSIS — K219 Gastro-esophageal reflux disease without esophagitis: Secondary | ICD-10-CM | POA: Diagnosis not present

## 2022-11-12 DIAGNOSIS — R197 Diarrhea, unspecified: Secondary | ICD-10-CM | POA: Diagnosis not present

## 2022-11-12 DIAGNOSIS — M545 Low back pain, unspecified: Secondary | ICD-10-CM | POA: Diagnosis not present

## 2022-11-12 DIAGNOSIS — G8929 Other chronic pain: Secondary | ICD-10-CM | POA: Diagnosis not present

## 2022-11-12 MED ORDER — PREDNISONE 10 MG (21) PO TBPK
ORAL_TABLET | ORAL | 0 refills | Status: AC
  Filled 2022-11-12: qty 21, 6d supply, fill #0

## 2022-11-12 MED ORDER — PANTOPRAZOLE SODIUM 40 MG PO TBEC
40.0000 mg | DELAYED_RELEASE_TABLET | Freq: Every day | ORAL | 2 refills | Status: AC
  Filled 2022-11-12: qty 30, 30d supply, fill #0

## 2022-11-24 ENCOUNTER — Other Ambulatory Visit (HOSPITAL_COMMUNITY): Payer: Self-pay

## 2022-12-06 ENCOUNTER — Other Ambulatory Visit (HOSPITAL_COMMUNITY): Payer: Self-pay

## 2022-12-21 ENCOUNTER — Other Ambulatory Visit (HOSPITAL_COMMUNITY): Payer: Self-pay

## 2023-01-11 ENCOUNTER — Ambulatory Visit: Payer: Commercial Managed Care - PPO | Admitting: Orthopedic Surgery

## 2023-01-11 DIAGNOSIS — L820 Inflamed seborrheic keratosis: Secondary | ICD-10-CM | POA: Diagnosis not present

## 2023-01-11 DIAGNOSIS — B078 Other viral warts: Secondary | ICD-10-CM | POA: Diagnosis not present

## 2023-01-11 DIAGNOSIS — L821 Other seborrheic keratosis: Secondary | ICD-10-CM | POA: Diagnosis not present

## 2023-01-11 DIAGNOSIS — L218 Other seborrheic dermatitis: Secondary | ICD-10-CM | POA: Diagnosis not present

## 2023-01-25 ENCOUNTER — Ambulatory Visit (INDEPENDENT_AMBULATORY_CARE_PROVIDER_SITE_OTHER): Payer: Commercial Managed Care - PPO | Admitting: Orthopedic Surgery

## 2023-01-25 ENCOUNTER — Other Ambulatory Visit (INDEPENDENT_AMBULATORY_CARE_PROVIDER_SITE_OTHER): Payer: Self-pay

## 2023-01-25 ENCOUNTER — Encounter: Payer: Self-pay | Admitting: Orthopedic Surgery

## 2023-01-25 VITALS — BP 115/71 | HR 68 | Ht 66.0 in | Wt 181.0 lb

## 2023-01-25 DIAGNOSIS — M542 Cervicalgia: Secondary | ICD-10-CM | POA: Diagnosis not present

## 2023-01-25 NOTE — Progress Notes (Signed)
New Patient Visit  Assessment: Lauren Hansen is a 67 y.o. female with the following: 1. Neck pain  Plan: Lauren Hansen had acute onset neck pain, with radiating pains to the left shoulder approximately 2 weeks ago.  Her symptoms improved with prednisone, which she had at home for her back.  She is not doing much better.  Radiographs demonstrates some signs of degenerative changes, without anterolisthesis.  Nothing further is needed this time.  She has excellent strength.  Sensation is intact in the upper extremities.  Warning signs were discussed.  She understands.  If she has any further issues, she will contact clinic.  Follow-up: Return if symptoms worsen or fail to improve.  Subjective:  Chief Complaint  Patient presents with   Neck Pain    Patient fell in October 24  pain started to get worse after  she took prednisone and it helps  she gets dizzy and her left hand goes numb when driving and holding the hand on the wheel     History of Present Illness: Lauren Hansen is a 67 y.o. female who presents for evaluation of neck pain.  Approximately 2 months ago, she fell while putting up [employer] decorations.  She started to have severe neck pain, radiating from the left side of her neck into her left shoulder.  This continued for a while.  Medications were not effective.  She had some prednisone at home, which she started taking couple of weeks ago.  She states that she took the prednisone for 2 days, and her pain and associated symptoms quickly improved.  She is doing much better at this time.  She does report that she gets dizzy, depending on the positioning of her head, or if she turns quickly.  She will occasionally get some numbness in the left hand, especially when driving.  Overall, she feels much better, and wanted to ensure that nothing further was needed.   Review of Systems: No fevers or chills Occasional numbness No chest pain No shortness of breath No bowel or bladder  dysfunction No GI distress No headaches   Medical History:  Past Medical History:  Diagnosis Date   Atypical nevus 07/24/2001   Right Cheek-Slight to Moderate   Atypical nevus 06/30/2017   Left Scapula-Moderate   Cervical radiculopathy    Chronic neck and back pain    Dysplastic nevus 07/24/2001   Right Abdomen   Lumbar radiculopathy    Plantar fasciitis     Past Surgical History:  Procedure Laterality Date   ABDOMINAL HYSTERECTOMY     APPENDECTOMY     COLONOSCOPY N/A 05/11/2012   Procedure: COLONOSCOPY;  Surgeon: Malissa Hippo, MD;  Location: [facility];  Service: Endoscopy;  Laterality: N/A;  1030   COLONOSCOPY WITH PROPOFOL N/A 10/28/2021   Procedure: COLONOSCOPY WITH PROPOFOL;  Surgeon: Dolores Frame, MD;  Location: [facility];  Service: Gastroenterology;  Laterality: N/A;  1045 ASA 2   COLONOSCOPY WITH PROPOFOL N/A 10/29/2021   Procedure: COLONOSCOPY WITH PROPOFOL;  Surgeon: Dolores Frame, MD;  Location: [facility];  Service: Gastroenterology;  Laterality: N/A;  1115 ASA 2   EXTRACORPOREAL SHOCK WAVE LITHOTRIPSY Left 06/28/2019   Procedure: EXTRACORPOREAL SHOCK WAVE LITHOTRIPSY (ESWL);  Surgeon: Sebastian Ache, MD;  [facility];  Service: Urology;  Laterality: Left;   POLYPECTOMY  10/29/2021   Procedure: POLYPECTOMY;  Surgeon: Dolores Frame, MD;  Location: [facility];  Service: Gastroenterology;;   right  hand surg       Family History  Family history unknown: Yes   Social History   Tobacco Use   Smoking status: Never    Passive exposure: Past   Smokeless tobacco: Never  Vaping Use   Vaping status: Never Used  Substance Use Topics   Alcohol use: Yes    Comment: social use   Drug use: No    No Known Allergies  Current Meds  Medication Sig   acetaminophen (TYLENOL) 500 MG tablet Take 500 mg by mouth every 6 (six) hours as needed for moderate pain.   fluticasone (FLONASE) 50 MCG/ACT  nasal spray Place 2 sprays into both nostrils daily. (Patient taking differently: Place 2 sprays into both nostrils daily as needed for allergies.)   ketoconazole (NIZORAL) 2 % shampoo Apply to affected areas topically once daily. Lather and leave in place for 5 minutes, then rinse off with water.   KRILL OIL PO Take 1 capsule by mouth every 30 (thirty) days. One daily   levocetirizine (XYZAL) 5 MG tablet Take 1 tablet (5 mg total) by mouth daily.   meloxicam (MOBIC) 7.5 MG tablet Take 1 tablet (7.5 mg total) by mouth daily.   methocarbamol (ROBAXIN) 500 MG tablet Take 1 tablet (500 mg total) by mouth 3 (three) times daily. (Patient taking differently: Take 500 mg by mouth 2 (two) times daily as needed for muscle spasms.)   methocarbamol (ROBAXIN) 500 MG tablet Take 1 tablet (500 mg total) by mouth 2 (two) times daily.   pantoprazole (PROTONIX) 40 MG tablet Take 1 tablet (40 mg total) by mouth daily.   predniSONE (STERAPRED UNI-PAK 21 TAB) 10 MG (21) TBPK tablet Take by mouth as directed per package instructions (6-5-4-3-2-1)   Semaglutide, 1 MG/DOSE, (OZEMPIC, 1 MG/DOSE,) 4 MG/3ML SOPN Inject 1 mg into the skin once a week.   Semaglutide,0.25 or 0.5MG /DOS, (OZEMPIC, 0.25 OR 0.5 MG/DOSE,) 2 MG/3ML SOPN Inject 0.25 mg into the skin once a week.   VITAMIN D PO Take 1 capsule by mouth every 30 (thirty) days.    Objective: BP 115/71   Pulse 68   Ht 5\' 6"  (1.676 m)   Wt 181 lb (82.1 kg)   BMI 29.21 kg/m   Physical Exam:  General: Alert and oriented. and No acute distress. Gait: Normal gait.  Evaluation of the neck demonstrates no deformity.  No swelling.  Slightly restricted range of motion.  She has excellent upper body strength bilaterally.  Sensation is intact throughout bilateral upper extremities.  Fingers warm and well-perfused.  IMAGING: I personally ordered and reviewed the following images  X-ray of the cervical spine was obtained in clinic today.  No acute injuries are noted.   Slight loss of normal curvature.  Well-maintained disc height, with slight loss at C5-6 and C6-7.  There are anterior based osteophytes at these levels as well.  No anterolisthesis.  Impression: Cervical spine x-ray with mild degenerative changes overall   New Medications:  No orders of the defined types were placed in this encounter.     Oliver Barre, MD  01/25/2023 9:32 AM

## 2023-05-03 DIAGNOSIS — E559 Vitamin D deficiency, unspecified: Secondary | ICD-10-CM | POA: Diagnosis not present

## 2023-05-03 DIAGNOSIS — E785 Hyperlipidemia, unspecified: Secondary | ICD-10-CM | POA: Diagnosis not present

## 2023-05-03 DIAGNOSIS — E119 Type 2 diabetes mellitus without complications: Secondary | ICD-10-CM | POA: Diagnosis not present

## 2023-05-18 DIAGNOSIS — D225 Melanocytic nevi of trunk: Secondary | ICD-10-CM | POA: Diagnosis not present

## 2023-05-18 DIAGNOSIS — I820 Budd-Chiari syndrome: Secondary | ICD-10-CM | POA: Diagnosis not present

## 2023-05-27 ENCOUNTER — Other Ambulatory Visit (HOSPITAL_COMMUNITY): Payer: Self-pay

## 2023-05-27 MED ORDER — OZEMPIC (1 MG/DOSE) 4 MG/3ML ~~LOC~~ SOPN
1.0000 mg | PEN_INJECTOR | SUBCUTANEOUS | 3 refills | Status: AC
  Filled 2023-05-27 – 2023-08-05 (×2): qty 3, 28d supply, fill #0
  Filled 2023-11-14: qty 3, 28d supply, fill #1
  Filled 2024-01-25: qty 3, 28d supply, fill #0
  Filled 2024-01-25: qty 3, 28d supply, fill #2

## 2023-05-27 MED ORDER — REPATHA 140 MG/ML ~~LOC~~ SOSY
PREFILLED_SYRINGE | SUBCUTANEOUS | 6 refills | Status: AC
  Filled 2023-05-27: qty 3, 28d supply, fill #0
  Filled 2023-11-14: qty 2, 30d supply, fill #0

## 2023-06-04 ENCOUNTER — Other Ambulatory Visit (HOSPITAL_COMMUNITY): Payer: Self-pay

## 2023-06-04 ENCOUNTER — Other Ambulatory Visit (HOSPITAL_BASED_OUTPATIENT_CLINIC_OR_DEPARTMENT_OTHER): Payer: Self-pay

## 2023-06-06 ENCOUNTER — Encounter: Payer: Self-pay | Admitting: Pharmacist

## 2023-06-06 ENCOUNTER — Other Ambulatory Visit: Payer: Self-pay

## 2023-06-07 ENCOUNTER — Other Ambulatory Visit (HOSPITAL_COMMUNITY): Payer: Self-pay

## 2023-06-07 ENCOUNTER — Other Ambulatory Visit: Payer: Self-pay

## 2023-06-11 ENCOUNTER — Other Ambulatory Visit: Payer: Self-pay

## 2023-06-12 ENCOUNTER — Other Ambulatory Visit: Payer: Self-pay

## 2023-06-13 ENCOUNTER — Other Ambulatory Visit (HOSPITAL_COMMUNITY): Payer: Self-pay

## 2023-06-13 MED ORDER — METHOCARBAMOL 500 MG PO TABS
500.0000 mg | ORAL_TABLET | Freq: Two times a day (BID) | ORAL | 0 refills | Status: DC
  Filled 2023-06-13 – 2023-08-05 (×2): qty 90, 45d supply, fill #0

## 2023-06-22 ENCOUNTER — Other Ambulatory Visit (HOSPITAL_COMMUNITY): Payer: Self-pay

## 2023-06-23 ENCOUNTER — Other Ambulatory Visit (HOSPITAL_COMMUNITY): Payer: Self-pay

## 2023-08-05 ENCOUNTER — Other Ambulatory Visit (HOSPITAL_COMMUNITY): Payer: Self-pay

## 2023-08-05 ENCOUNTER — Other Ambulatory Visit: Payer: Self-pay

## 2023-09-05 ENCOUNTER — Other Ambulatory Visit (HOSPITAL_COMMUNITY): Payer: Self-pay

## 2023-09-05 DIAGNOSIS — M545 Low back pain, unspecified: Secondary | ICD-10-CM

## 2023-09-13 ENCOUNTER — Ambulatory Visit (HOSPITAL_COMMUNITY)
Admission: RE | Admit: 2023-09-13 | Discharge: 2023-09-13 | Disposition: A | Discharge status: Home / Self Care | Source: Ambulatory Visit

## 2023-09-13 DIAGNOSIS — M545 Low back pain, unspecified: Secondary | ICD-10-CM | POA: Diagnosis not present

## 2023-09-13 DIAGNOSIS — M5137 Other intervertebral disc degeneration, lumbosacral region with discogenic back pain only: Secondary | ICD-10-CM | POA: Diagnosis not present

## 2023-09-13 DIAGNOSIS — M5126 Other intervertebral disc displacement, lumbar region: Secondary | ICD-10-CM | POA: Diagnosis not present

## 2023-09-13 DIAGNOSIS — M5136 Other intervertebral disc degeneration, lumbar region with discogenic back pain only: Secondary | ICD-10-CM | POA: Diagnosis not present

## 2023-09-13 DIAGNOSIS — M47816 Spondylosis without myelopathy or radiculopathy, lumbar region: Secondary | ICD-10-CM | POA: Diagnosis not present

## 2023-09-28 DIAGNOSIS — S233XXA Sprain of ligaments of thoracic spine, initial encounter: Secondary | ICD-10-CM | POA: Diagnosis not present

## 2023-09-28 DIAGNOSIS — S134XXA Sprain of ligaments of cervical spine, initial encounter: Secondary | ICD-10-CM | POA: Diagnosis not present

## 2023-11-14 ENCOUNTER — Other Ambulatory Visit: Payer: Self-pay

## 2023-11-14 ENCOUNTER — Other Ambulatory Visit (HOSPITAL_COMMUNITY): Payer: Self-pay

## 2023-11-14 MED ORDER — MELOXICAM 7.5 MG PO TABS
7.5000 mg | ORAL_TABLET | Freq: Every day | ORAL | 1 refills | Status: AC
  Filled 2023-11-14: qty 90, 90d supply, fill #0
  Filled 2024-01-25: qty 90, 90d supply, fill #1

## 2023-11-14 MED ORDER — METHOCARBAMOL 500 MG PO TABS
500.0000 mg | ORAL_TABLET | Freq: Two times a day (BID) | ORAL | 0 refills | Status: DC
  Filled 2023-11-14: qty 90, 45d supply, fill #0

## 2023-11-15 DIAGNOSIS — H52223 Regular astigmatism, bilateral: Secondary | ICD-10-CM | POA: Diagnosis not present

## 2023-11-15 DIAGNOSIS — H5213 Myopia, bilateral: Secondary | ICD-10-CM | POA: Diagnosis not present

## 2023-11-15 DIAGNOSIS — H524 Presbyopia: Secondary | ICD-10-CM | POA: Diagnosis not present

## 2023-11-23 DIAGNOSIS — E559 Vitamin D deficiency, unspecified: Secondary | ICD-10-CM | POA: Diagnosis not present

## 2023-11-23 DIAGNOSIS — E785 Hyperlipidemia, unspecified: Secondary | ICD-10-CM | POA: Diagnosis not present

## 2024-01-03 ENCOUNTER — Other Ambulatory Visit: Payer: Self-pay

## 2024-01-10 DIAGNOSIS — S134XXA Sprain of ligaments of cervical spine, initial encounter: Secondary | ICD-10-CM | POA: Diagnosis not present

## 2024-01-10 DIAGNOSIS — S233XXA Sprain of ligaments of thoracic spine, initial encounter: Secondary | ICD-10-CM | POA: Diagnosis not present

## 2024-01-25 ENCOUNTER — Other Ambulatory Visit: Payer: Self-pay

## 2024-01-25 ENCOUNTER — Other Ambulatory Visit (HOSPITAL_COMMUNITY): Payer: Self-pay

## 2024-01-25 MED ORDER — METHOCARBAMOL 500 MG PO TABS
500.0000 mg | ORAL_TABLET | Freq: Two times a day (BID) | ORAL | 0 refills | Status: AC
  Filled 2024-01-25: qty 90, 45d supply, fill #0
